# Patient Record
Sex: Male | Born: 1965
Health system: Southern US, Community
[De-identification: ages and names within clinical notes are randomized; demographics above are authoritative.]

## PROBLEM LIST (undated history)

## (undated) DIAGNOSIS — I10 Essential (primary) hypertension: Secondary | ICD-10-CM

## (undated) DIAGNOSIS — E0590 Thyrotoxicosis, unspecified without thyrotoxic crisis or storm: Secondary | ICD-10-CM

## (undated) DIAGNOSIS — E291 Testicular hypofunction: Secondary | ICD-10-CM

## (undated) HISTORY — DX: Testicular hypofunction: E29.1

## (undated) HISTORY — DX: Thyrotoxicosis, unspecified without thyrotoxic crisis or storm: E05.90

## (undated) HISTORY — PX: HEEL SPUR EXCISION: SHX1733

## (undated) HISTORY — DX: Essential (primary) hypertension: I10

---
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
## 1997-11-22 HISTORY — PX: CHOLECYSTECTOMY: SHX55

## 2005-06-22 ENCOUNTER — Ambulatory Visit (HOSPITAL_BASED_OUTPATIENT_CLINIC_OR_DEPARTMENT_OTHER): Admission: RE | Admit: 2005-06-22 | Discharge: 2005-06-22 | Payer: Self-pay | Admitting: Radiation Oncology

## 2005-06-22 ENCOUNTER — Ambulatory Visit (HOSPITAL_COMMUNITY): Admission: RE | Admit: 2005-06-22 | Discharge: 2005-06-22 | Payer: Self-pay | Admitting: Radiation Oncology

## 2016-08-13 ENCOUNTER — Encounter: Payer: Self-pay | Admitting: Endocrinology

## 2016-08-13 ENCOUNTER — Ambulatory Visit (INDEPENDENT_AMBULATORY_CARE_PROVIDER_SITE_OTHER): Payer: 59 | Admitting: Endocrinology

## 2016-08-13 DIAGNOSIS — E291 Testicular hypofunction: Secondary | ICD-10-CM

## 2016-08-13 DIAGNOSIS — E0590 Thyrotoxicosis, unspecified without thyrotoxic crisis or storm: Secondary | ICD-10-CM | POA: Insufficient documentation

## 2016-08-13 LAB — IBC PANEL
IRON: 66 ug/dL (ref 42–165)
SATURATION RATIOS: 17.3 % — AB (ref 20.0–50.0)
TRANSFERRIN: 272 mg/dL (ref 212.0–360.0)

## 2016-08-13 LAB — LUTEINIZING HORMONE: LH: 1.1 m[IU]/mL — AB (ref 1.50–9.30)

## 2016-08-13 LAB — T4, FREE: Free T4: 0.9 ng/dL (ref 0.60–1.60)

## 2016-08-13 LAB — TSH: TSH: 0.3 u[IU]/mL — ABNORMAL LOW (ref 0.35–4.50)

## 2016-08-13 NOTE — Patient Instructions (Addendum)
Testosterone treatment has risks, including increased or decreased fertility (depending on the type of treatment), hair loss, prostate cancer, benign prostate enlargement, blood clots, liver problems, lower hdl ("good cholesterol"), polycythemia (opposite of anemia), sleep apnea, and behavior changes. blood tests are requested for you today.  We'll let you know about the results.  Weight loss helps the testosterone, also.

## 2016-08-13 NOTE — Progress Notes (Signed)
   Subjective:    Patient ID: Xavier Blevins, male    DOB: Aug 13, 1966, 50 y.o.   MRN: [id number]  HPI Pt reports he had puberty at the normal age.  He has 2 biological children.  He says he has never taken illicit androgens.  He has never been on any prescribed medication for hypogonadism.  He does not take antiandrogens or opioids.  He denies any h/o infertility, XRT, or genital infection.  He has never had surgery, or a serious injury to the head.  He does not consume alcohol excessively.  Pt states 1 year of slightly decreased hair throughout the body, and assoc ED.   Past Medical History:  Diagnosis Date  . Hyperthyroidism   . Hypogonadism male     No past surgical history on file.  Social History   Social History  . Marital status: Married    Spouse name: N/A  . Number of children: N/A  . Years of education: N/A   Occupational History  . Not on file.   Social History Main Topics  . Smoking status: Never Smoker  . Smokeless tobacco: Never Used  . Alcohol use Yes  . Drug use: Unknown  . Sexual activity: Not on file   Other Topics Concern  . Not on file   Social History Narrative  . No narrative on file    No current outpatient prescriptions on file prior to visit.   No current facility-administered medications on file prior to visit.     No Known Allergies  Family History  Problem Relation Age of Onset  . Other Father     hypogonadism    BP (!) 156/94   Pulse 87   Ht 5\' 7"  (1.702 m)   Wt 241 lb (109.3 kg)   SpO2 98%   BMI 37.75 kg/m     Review of Systems denies depression, numbness, weight change, decreased urinary stream, gynecomastia, muscle weakness, fever, headache, easy bruising, sob, rash, blurry vision, rhinorrhea, chest pain.        Objective:   Physical Exam VS: see vs page GEN: no distress HEAD: head: no deformity eyes: no periorbital swelling, no proptosis external nose and ears are normal mouth: no lesion seen NECK: supple,  thyroid is not enlarged CHEST WALL: no deformity LUNGS: clear to auscultation BREASTS:  Mild bilat pseudogynecomastia CV: reg rate and rhythm, no murmur ABD: abdomen is soft, nontender.  no hepatosplenomegaly.  not distended.  no hernia GENITALIA:  Normal male.   MUSCULOSKELETAL: muscle bulk and strength are grossly normal.  no obvious joint swelling.  gait is normal and steady EXTEMITIES: no deformity.  no edema PULSES: no carotid bruit NEURO:  cn 2-12 grossly intact.   readily moves all 4's.  sensation is intact to touch on all 4's SKIN:  Normal texture and temperature.  No rash or suspicious lesion is visible.  Normal adult male hair distribution NODES:  None palpable at the neck PSYCH: alert, well-oriented.  Does not appear anxious nor depressed.   outside test results are reviewed: Testosterone=239 TSH=0.30 and 0.34 LH=4  I have reviewed outside records, and summarized: Pt was noted to have hypogonadism.  He had ED sxs, but was noted to be in good general health    Assessment & Plan:  Hypogonadism, central, new to me.  he declines clomid and other rx.  He just wants to check to make sure nothing serious. ED: he declines rx, as he says it is mild.

## 2016-08-14 LAB — PROLACTIN: PROLACTIN: 5.6 ng/mL (ref 2.0–18.0)

## 2016-08-15 LAB — TESTOSTERONE,FREE AND TOTAL
TESTOSTERONE FREE: 5.6 pg/mL — AB (ref 6.8–21.5)
TESTOSTERONE: 206 ng/dL — AB (ref 264–916)

## 2016-08-17 ENCOUNTER — Telehealth: Payer: Self-pay

## 2016-08-17 ENCOUNTER — Telehealth: Payer: Self-pay | Admitting: Endocrinology

## 2016-08-17 DIAGNOSIS — E291 Testicular hypofunction: Secondary | ICD-10-CM

## 2016-08-17 NOTE — Telephone Encounter (Signed)
I contacted the patient and left a voicemail advising of message via voicemail. Requested a call back from the patient if he would like to start the oral medication for his testosterone.

## 2016-08-17 NOTE — Telephone Encounter (Signed)
Patient has question about his testosterone medication he do not know what the name of it is. Please advise

## 2016-08-17 NOTE — Telephone Encounter (Signed)
I contacted the patient and he stated he is ok with the oral testosterone

## 2016-08-17 NOTE — Telephone Encounter (Signed)
-----   Message from Romero BellingSean Ellison, MD sent at 08/15/2016  7:43 PM EDT ----- please call patient: Testosterone is slightly low again. Do you want me to prescribe a pill to increase it? Also, your thyroid is slightly off again.  All we need to do for this is to recheck in the future.

## 2016-08-19 MED ORDER — CLOMIPHENE CITRATE 50 MG PO TABS: ORAL_TABLET | ORAL | 5 refills | Status: DC

## 2016-08-19 NOTE — Telephone Encounter (Signed)
See message and please advise which medication needs to be called in.

## 2016-08-19 NOTE — Telephone Encounter (Signed)
please call patient: I have sent a prescription to your pharmacy, for the pill for the testosterone.  Please redo the blood test in 1 month.  Please come back for a follow-up appointment in 6 months.

## 2016-08-19 NOTE — Telephone Encounter (Signed)
Xavier MilletMegan this is your patient

## 2016-08-19 NOTE — Telephone Encounter (Signed)
Patient stated the prescription for his testosterone was not sent to the pharmacy. Please advise

## 2016-08-20 NOTE — Telephone Encounter (Signed)
I contacted the patient and advised of message. Patient voiced understanding and had no further questions.  

## 2016-09-20 ENCOUNTER — Ambulatory Visit (INDEPENDENT_AMBULATORY_CARE_PROVIDER_SITE_OTHER): Payer: 59 | Admitting: Endocrinology

## 2016-09-20 ENCOUNTER — Encounter: Payer: Self-pay | Admitting: Endocrinology

## 2016-09-20 VITALS — BP 138/86 | HR 91 | Ht 67.0 in | Wt 241.0 lb

## 2016-09-20 DIAGNOSIS — E0590 Thyrotoxicosis, unspecified without thyrotoxic crisis or storm: Secondary | ICD-10-CM | POA: Diagnosis not present

## 2016-09-20 DIAGNOSIS — E291 Testicular hypofunction: Secondary | ICD-10-CM | POA: Diagnosis not present

## 2016-09-20 LAB — TSH: TSH: 0.26 u[IU]/mL — AB (ref 0.35–4.50)

## 2016-09-20 NOTE — Progress Notes (Signed)
   Subjective:    Patient ID: Xavier Blevins, male    DOB: 21-Aug-1966, 50 y.o.   MRN: [id number]  HPI Pt returns for f/u of idiopathic central hypogonadism (dx'ed 2017; he has 2 biological children; he was rx'ed clomid).  He takes clomid as rx'ed. He says fatigue is improved Past Medical History:  Diagnosis Date  . Hyperthyroidism   . Hypogonadism male     No past surgical history on file.  Social History   Social History  . Marital status: Married    Spouse name: N/A  . Number of children: N/A  . Years of education: N/A   Occupational History  . Not on file.   Social History Main Topics  . Smoking status: Never Smoker  . Smokeless tobacco: Never Used  . Alcohol use Yes  . Drug use: Unknown  . Sexual activity: Not on file   Other Topics Concern  . Not on file   Social History Narrative  . No narrative on file    Current Outpatient Prescriptions on File Prior to Visit  Medication Sig Dispense Refill  . clomiPHENE (CLOMID) 50 MG tablet 1/4 tab daily 10 tablet 5  . Doxylamine Succinate, Sleep, (NITETIME SLEEP-AID PO) Take by mouth.    . Multiple Vitamin (MULTIVITAMIN) capsule Take 1 capsule by mouth daily.     No current facility-administered medications on file prior to visit.     No Known Allergies  Family History  Problem Relation Age of Onset  . Other Father     hypogonadism    BP 138/86   Pulse 91   Ht 5\' 7"  (1.702 m)   Wt 241 lb (109.3 kg)   SpO2 97%   BMI 37.75 kg/m    Review of Systems Denies decreased urination and breast pain.     Objective:   Physical Exam VITAL SIGNS:  See vs page GENERAL: no distress NECK: There is no palpable thyroid enlargement.  No thyroid nodule is palpable.  No palpable lymphadenopathy at the anterior neck. Ext: no edema.      Assessment & Plan:  Hypogonadism: due for recheck. Please continue the same medication. Check labs today

## 2016-09-20 NOTE — Patient Instructions (Signed)
blood tests are requested for you today.  We'll let you know about the results. Testosterone treatment has risks, including increased or decreased fertility (depending on the type of treatment), hair loss, prostate cancer, benign prostate enlargement, blood clots, liver problems, lower hdl ("good cholesterol"), polycythemia (opposite of anemia), sleep apnea, and behavior changes.   Please return in 1 year.   

## 2016-09-22 LAB — TESTOSTERONE,FREE AND TOTAL
TESTOSTERONE: 690 ng/dL (ref 264–916)
Testosterone, Free: 17.3 pg/mL (ref 6.8–21.5)

## 2016-12-28 DIAGNOSIS — H00014 Hordeolum externum left upper eyelid: Secondary | ICD-10-CM | POA: Diagnosis not present

## 2017-04-01 DIAGNOSIS — M1732 Unilateral post-traumatic osteoarthritis, left knee: Secondary | ICD-10-CM | POA: Diagnosis not present

## 2017-04-04 DIAGNOSIS — K645 Perianal venous thrombosis: Secondary | ICD-10-CM | POA: Diagnosis not present

## 2017-05-01 ENCOUNTER — Other Ambulatory Visit: Payer: Self-pay | Admitting: Endocrinology

## 2017-05-30 DIAGNOSIS — M1712 Unilateral primary osteoarthritis, left knee: Secondary | ICD-10-CM | POA: Diagnosis not present

## 2017-06-06 DIAGNOSIS — M1712 Unilateral primary osteoarthritis, left knee: Secondary | ICD-10-CM | POA: Diagnosis not present

## 2017-06-13 DIAGNOSIS — M1712 Unilateral primary osteoarthritis, left knee: Secondary | ICD-10-CM | POA: Diagnosis not present

## 2017-09-13 DIAGNOSIS — Z23 Encounter for immunization: Secondary | ICD-10-CM | POA: Diagnosis not present

## 2017-09-19 ENCOUNTER — Ambulatory Visit (INDEPENDENT_AMBULATORY_CARE_PROVIDER_SITE_OTHER): Payer: 59 | Admitting: Endocrinology

## 2017-09-19 ENCOUNTER — Encounter: Payer: Self-pay | Admitting: Endocrinology

## 2017-09-19 VITALS — BP 140/82 | HR 85 | Wt 232.2 lb

## 2017-09-19 DIAGNOSIS — Z125 Encounter for screening for malignant neoplasm of prostate: Secondary | ICD-10-CM

## 2017-09-19 DIAGNOSIS — E291 Testicular hypofunction: Secondary | ICD-10-CM | POA: Diagnosis not present

## 2017-09-19 DIAGNOSIS — E0590 Thyrotoxicosis, unspecified without thyrotoxic crisis or storm: Secondary | ICD-10-CM

## 2017-09-19 LAB — TSH: TSH: 0.2 u[IU]/mL — ABNORMAL LOW (ref 0.35–4.50)

## 2017-09-19 LAB — PSA: PSA: 0.44 ng/mL (ref 0.10–4.00)

## 2017-09-19 NOTE — Patient Instructions (Addendum)
blood tests are requested for you today.  We'll let you know about the results. Testosterone treatment has risks, including increased or decreased fertility (depending on the type of treatment), hair loss, prostate cancer, benign prostate enlargement, blood clots, liver problems, lower hdl ("good cholesterol"), polycythemia (opposite of anemia), sleep apnea, and behavior changes.   Please return in 1 year.   

## 2017-09-19 NOTE — Progress Notes (Signed)
   Subjective:    Patient ID: Xavier Blevins, male    DOB: Feb 26, 1966, 51 y.o.   MRN: [id number]  HPI Pt returns for f/u of idiopathic central hypogonadism (dx'ed 2017; he has 2 biological children; prolactin was normal; he was rx'ed clomid).  He takes clomid as rx'ed.  pt states he feels well in general.  He denies palpitations and ED sxs.  Past Medical History:  Diagnosis Date  . Hyperthyroidism   . Hypogonadism male     No past surgical history on file.  Social History   Social History  . Marital status: Married    Spouse name: N/A  . Number of children: N/A  . Years of education: N/A   Occupational History  . Not on file.   Social History Main Topics  . Smoking status: Never Smoker  . Smokeless tobacco: Never Used  . Alcohol use Yes  . Drug use: Unknown  . Sexual activity: Not on file   Other Topics Concern  . Not on file   Social History Narrative  . No narrative on file    Current Outpatient Prescriptions on File Prior to Visit  Medication Sig Dispense Refill  . clomiPHENE (CLOMID) 50 MG tablet TAKE 1/4 TABLET BY MOUTH DAILY 10 tablet 5  . Multiple Vitamin (MULTIVITAMIN) capsule Take 1 capsule by mouth daily.    . Doxylamine Succinate, Sleep, (NITETIME SLEEP-AID PO) Take by mouth.     No current facility-administered medications on file prior to visit.     No Known Allergies  Family History  Problem Relation Age of Onset  . Other Father        hypogonadism    BP 140/82   Pulse 85   Wt 232 lb 3.2 oz (105.3 kg)   SpO2 98%   BMI 36.37 kg/m    Review of Systems Denies tremor and sob.     Objective:   Physical Exam VITAL SIGNS:  See vs page.  GENERAL: no distress. Neck: thyroid is slightly and diffusely enlarged.  Ext: no leg edema.       Assessment & Plan:  Mild hyperthyroidism, due for recheck Hypogonadism: clinically well-controlled.   Patient Instructions  blood tests are requested for you today.  We'll let you know about the  results.  Testosterone treatment has risks, including increased or decreased fertility (depending on the type of treatment), hair loss, prostate cancer, benign prostate enlargement, blood clots, liver problems, lower hdl ("good cholesterol"), polycythemia (opposite of anemia), sleep apnea, and behavior changes.  Please return in 1 year.

## 2017-09-20 LAB — TESTOSTERONE,FREE AND TOTAL
TESTOSTERONE FREE: 12.4 pg/mL (ref 6.8–21.5)
Testosterone: 486 ng/dL (ref 264–916)

## 2017-09-21 ENCOUNTER — Other Ambulatory Visit: Payer: Self-pay

## 2017-09-21 ENCOUNTER — Telehealth: Payer: Self-pay | Admitting: Endocrinology

## 2017-09-21 MED ORDER — CLOMIPHENE CITRATE 50 MG PO TABS: 12.5000 mg | ORAL_TABLET | Freq: Every day | ORAL | 5 refills | Status: DC

## 2017-09-21 NOTE — Telephone Encounter (Signed)
Caller Name: Sherlie BanDaniel Blevins  Best Number: [phone]  Pharmacy: PLEASE Change to [facility]  [address] [place]  Reason for call: pt currently uses [facility], however he would like to change clomiPHENE (CLOMID) 50 MG tablet to [facility] on [address] [facility] [place].  Please call in a refill

## 2017-09-21 NOTE — Telephone Encounter (Signed)
done

## 2017-09-26 ENCOUNTER — Telehealth: Payer: Self-pay | Admitting: Endocrinology

## 2017-09-26 NOTE — Telephone Encounter (Signed)
Please call patient at ph# [phone] re: a prescription.He needs clarification. Old prescription said 1/4 pill a day. New script says 1/2 pill a day. Patient wants to know why the increase.

## 2017-09-26 NOTE — Telephone Encounter (Signed)
Ok, as of now, are you taking 1/4/or 1/2 qd?

## 2017-09-26 NOTE — Telephone Encounter (Signed)
Patient need clarification, clomiPHENE (CLOMID) 50 MG tablet [1753] Please advise

## 2017-09-26 NOTE — Telephone Encounter (Signed)
I called patient & left VM for him to return call so I could clarify prescription.

## 2017-09-27 MED ORDER — CLOMIPHENE CITRATE 50 MG PO TABS: ORAL_TABLET | ORAL | 5 refills | Status: DC

## 2017-09-27 NOTE — Telephone Encounter (Signed)
please call patient: Here is what happened.  It was ordered as 1/4 per day, but the computer assumed it was a mistake, and changed to 1/2.  We have overridden this.  Thank you for the call

## 2017-09-27 NOTE — Telephone Encounter (Signed)
Called and clarified with patient.

## 2017-09-27 NOTE — Telephone Encounter (Signed)
He has been taking 1/4.

## 2018-02-01 DIAGNOSIS — M1712 Unilateral primary osteoarthritis, left knee: Secondary | ICD-10-CM | POA: Diagnosis not present

## 2018-02-01 DIAGNOSIS — M25562 Pain in left knee: Secondary | ICD-10-CM | POA: Diagnosis not present

## 2018-02-09 ENCOUNTER — Ambulatory Visit: Payer: Self-pay | Admitting: Orthopedic Surgery

## 2018-02-27 ENCOUNTER — Ambulatory Visit: Payer: Self-pay | Admitting: Orthopedic Surgery

## 2018-02-27 NOTE — H&P (View-Only) (Signed)
Xavier Blevins is an 52 y.o. male.   Chief Complaint: L knee pain HPI: The patient is scheduled for a left total knee replacement and removal of retained hardware by Dr. Beane. The surgery is scheduled on 03/15/18 at [facility].  Xavier Blevins reports progressively worsening left knee pain for many years, has a history of ACL reconstruction and knee arthroscopy. Reports symptoms of pain, stiffness and instability. At this point the pain is limiting his activities of daily living and quality-of-life and he would like to proceed with further intervention. He is alert he failed injection therapy, quad strengthening, physical therapy, activity modifications, bracing, medications, relative rest.  Dr. Beane and the patient mutually agreed to proceed with a left total knee replacement. Risks and benefits of the procedure were discussed including stiffness, suboptimal range of motion, persistent pain, infection requiring removal of prosthesis and reinsertion, need for prophylactic antibiotics in the future, for example, dental procedures, possible need for manipulation, revision in the future and also anesthetic complications including DVT, PE, etc. We discussed the perioperative course, time in the hospital, postoperative recovery and the need for elevation to control swelling. We also discussed the predicted range of motion and the probability that squatting and kneeling would be unobtainable in the future. In addition, postoperative anticoagulation was discussed. We have obtained preoperative medical clearance as necessary. Provided illustrated handout and discussed it in detail. They will enroll in the total joint replacement educational forum at the hospital.  His preop appointment is 1 week from today at [facility].  Past Medical History:  Diagnosis Date  . Hyperthyroidism   . Hypogonadism male    Surgical History Foot/Ankle Surgery - spur removed right great toe Cholecystectomy Knee Arthroscopy  - left x 3 L knee ACL reconstruction  Medications clomiPHENE citrate 50 mg tablet TAKE 1/4 TABLET BY MOUTH DAILY Ibuprofen prn  Family History  Problem Relation Age of Onset  . Other Father        hypogonadism  Father: Hypertensive disorder, Leukemia, Diabetes mellitus  Social History:  reports that he has never smoked. He has never used smokeless tobacco. He reports that he drinks alcohol. His drug history is not on file.  Allergies: No Known Allergies   Review of Systems  Constitutional: Negative.   HENT: Negative.   Eyes: Negative.   Respiratory: Positive for cough.        Believes allergy related. Improved with mucinex  Cardiovascular: Negative.   Gastrointestinal: Negative.   Genitourinary: Negative.   Musculoskeletal: Positive for joint pain.  Skin: Negative.     There were no vitals taken for this visit. Physical Exam  Constitutional: He is oriented to person, place, and time. He appears well-developed and well-nourished.  HENT:  Head: Normocephalic.  Eyes: Pupils are equal, round, and reactive to light.  Neck: Normal range of motion.  Cardiovascular: Normal rate.  Respiratory: Effort normal.  GI: Soft.  Musculoskeletal:  On examination of the left knee, well healed scars medial and lateral aspect of the knee from ACL reconstruction. He is tender medial joint line. Nontender lateral joint line, patellar tendon, quadriceps tendon, patella, peroneal nerve and popliteal space. No calf pain or sign of DVT. No pain or laxity with varus or valgus stress. No instability noted. Negative McMurray's. Mild effusion noted. Range of motion is approximately -5-90. Positive patellofemoral crepitus. No patellofemoral pain on compression. Sensation intact distally.  Neurological: He is alert and oriented to person, place, and time.  Skin: Skin is warm and dry.      Prior x-rays from May 2018 reviewed, retained hardware following ACL reconstruction. He is bone-on-bone in the medial  compartment, with a varus deformity. He also demonstrates end-stage degenerative changes in the patellofemoral compartment.  Assessment/Plan Left knee end-stage posttraumatic osteoarthritis  Plan: Pt with end-stage Left knee DJD, bone-on-bone, refractory to conservative tx, scheduled for Left total knee replacement and removal of retained hardware by Dr. Beane on 03/15/18. We again discussed the procedure itself as well as risks, complications and alternatives, including but not limited to DVT, PE, infx, bleeding, failure of procedure, need for secondary procedure including manipulation, nerve injury, ongoing pain/symptoms, anesthesia risk, even stroke or death. Also discussed typical post-op protocols, activity restrictions, need for PT, flexion/extension exercises, time out of work. Discussed need for DVT ppx post-op per protocol. Discussed dental ppx and infx prevention. Also discussed limitations post-operatively such as kneeling and squatting. All questions were answered. Patient desires to proceed with surgery as scheduled.  Will hold supplements, ASA and NSAIDs accordingly. Will remain NPO after midnight the night before surgery. Will present to [facility] for pre-op testing as scheduled. Anticipate hospital stay to include at least 2 midnights to ensure proper pain control. Plan ASA 325mg BID for DVT ppx post-op. Plan Percocet, Robaxin, Colace, Miralax. Plan home with HHPT post-op with family members at home for assistance then transition to outpt at his post op visit. Will follow up 10-14 days post-op for staple removal and xrays. He is hoping to return to work after his 2 week post-op visit.  Plan Left total knee replacement and removal of retained hardware   BISSELL, JACLYN M., PA-C for Dr. Beane 02/27/2018, 9:54 AM   

## 2018-02-27 NOTE — H&P (Signed)
Xavier BanDaniel Blevins is an 52 y.o. male.   Chief Complaint: L knee pain HPI: The patient is scheduled for a left total knee replacement and removal of retained hardware by Dr. Shelle IronBeane. The [facility].  Xavier Blevins reports progressively worsening left knee pain for many years, has a history of ACL reconstruction and knee arthroscopy. Reports symptoms of pain, stiffness and instability. At this point the pain is limiting his activities of daily living and quality-of-life and he would like to proceed with further intervention. He is alert he failed injection therapy, quad strengthening, physical therapy, activity modifications, bracing, medications, relative rest.  Dr. Shelle IronBeane and the patient mutually agreed to proceed with a left total knee replacement. Risks and benefits of the procedure were discussed including stiffness, suboptimal range of motion, persistent pain, infection requiring removal of prosthesis and reinsertion, need for prophylactic antibiotics in the future, for example, dental procedures, possible need for manipulation, revision in the future and also anesthetic complications including DVT, PE, etc. We discussed the perioperative course, time in the hospital, postoperative recovery and the need for elevation to control swelling. We also discussed the predicted range of motion and the probability that squatting and kneeling would be unobtainable in the future. In addition, postoperative anticoagulation was discussed. We have obtained preoperative medical clearance as necessary. Provided illustrated handout and discussed it in detail. They will enroll in the total joint replacement educational forum at the hospital.  [facility]  Past Medical History:  Diagnosis Date  . Hyperthyroidism   . Hypogonadism male    Surgical History Foot/Ankle Surgery - spur removed right great toe Cholecystectomy Knee Arthroscopy  - left x 3 L knee ACL reconstruction  Medications clomiPHENE citrate 50 mg tablet TAKE 1/4 TABLET BY MOUTH DAILY Ibuprofen prn  Family History  Problem Relation Age of Onset  . Other Father        hypogonadism  Father: Hypertensive disorder, Leukemia, Diabetes mellitus  Social History:  reports that he has never smoked. He has never used smokeless tobacco. He reports that he drinks alcohol. His drug history is not on file.  Allergies: No Known Allergies   Review of Systems  Constitutional: Negative.   HENT: Negative.   Eyes: Negative.   Respiratory: Positive for cough.        Believes allergy related. Improved with mucinex  Cardiovascular: Negative.   Gastrointestinal: Negative.   Genitourinary: Negative.   Musculoskeletal: Positive for joint pain.  Skin: Negative.     There were no vitals taken for this visit. Physical Exam  Constitutional: He is oriented to person, place, and time. He appears well-developed and well-nourished.  HENT:  Head: Normocephalic.  Eyes: Pupils are equal, round, and reactive to light.  Neck: Normal range of motion.  Cardiovascular: Normal rate.  Respiratory: Effort normal.  GI: Soft.  Musculoskeletal:  On examination of the left knee, well healed scars medial and lateral aspect of the knee from ACL reconstruction. He is tender medial joint line. Nontender lateral joint line, patellar tendon, quadriceps tendon, patella, peroneal nerve and popliteal space. No calf pain or sign of DVT. No pain or laxity with varus or valgus stress. No instability noted. Negative McMurray's. Mild effusion noted. Range of motion is approximately -5-90. Positive patellofemoral crepitus. No patellofemoral pain on compression. Sensation intact distally.  Neurological: He is alert and oriented to person, place, and time.  Skin: Skin is warm and dry.  Prior x-rays from May 2018 reviewed, retained hardware following ACL reconstruction. He is bone-on-bone in the medial  compartment, with a varus deformity. He also demonstrates end-stage degenerative changes in the patellofemoral compartment.  Assessment/Plan Left knee end-stage posttraumatic osteoarthritis  Plan: Pt with end-stage Left knee DJD, bone-on-bone, refractory to conservative tx, scheduled for Left total knee replacement and removal of retained hardware by Dr. Shelle Iron on 03/15/18. We again discussed the procedure itself as well as risks, complications and alternatives, including but not limited to DVT, PE, infx, bleeding, failure of procedure, need for secondary procedure including manipulation, nerve injury, ongoing pain/symptoms, anesthesia risk, even stroke or death. Also discussed typical post-op protocols, activity restrictions, need for PT, flexion/extension exercises, time out of work. Discussed need for DVT ppx post-op per protocol. Discussed dental ppx and infx prevention. Also discussed limitations post-operatively such as kneeling and squatting. All questions were answered. Patient desires to proceed with surgery as scheduled.  Will hold supplements, ASA and NSAIDs accordingly. Will remain NPO after midnight the night before surgery. Will present to [facility] for pre-op testing as scheduled. Anticipate hospital stay to include at least 2 midnights to ensure proper pain control. Plan ASA 325mg  BID for DVT ppx post-op. Plan Percocet, Robaxin, Colace, Miralax. Plan home with HHPT post-op with family members at home for assistance then transition to outpt at his post op visit. Will follow up 10-14 days post-op for staple removal and xrays. He is hoping to return to work after his 2 week post-op visit.  Plan Left total knee replacement and removal of retained hardware   [name]., PA-C for Dr. Shelle Iron 02/27/2018, 9:54 AM

## 2018-03-03 NOTE — Patient Instructions (Addendum)
Sherlie BanDaniel Minchew  03/03/2018   Your procedure is scheduled on: 03-15-18   [facility]  Entrance Report to Admitting at 5:30 AM    Call this number if you have problems the morning of surgery [phone]   Remember: Do not eat food or drink liquids :After Midnight.     Take these medicines the morning of surgery with A SIP OF WATER: None                                You may not have any metal on your body including hair pins and              piercings  Do not wear jewelry, lotions, powders or deodorant             Men may shave face and neck.   Do not bring valuables to the hospital. [facility] IS NOT             RESPONSIBLE   FOR VALUABLES.  Contacts, dentures or bridgework may not be worn into surgery.  Leave suitcase in the car. After surgery it may be brought to your room.                  Please read over the following fact sheets you were given: _____________________________________________________________________          [facility] - Preparing for Surgery Before surgery, you can play an important role.  Because skin is not sterile, your skin needs to be as free of germs as possible.  You can reduce the number of germs on your skin by washing with CHG (chlorahexidine gluconate) soap before surgery.  CHG is an antiseptic cleaner which kills germs and bonds with the skin to continue killing germs even after washing. Please DO NOT use if you have an allergy to CHG or antibacterial soaps.  If your skin becomes reddened/irritated stop using the CHG and inform your nurse when you arrive at Short Stay. Do not shave (including legs and underarms) for at least 48 hours prior to the first CHG shower.  You may shave your face/neck. Please follow these instructions carefully:  1.  Shower with CHG Soap the night before surgery and the  morning of Surgery.  2.  If you choose to wash your hair, wash your hair first as usual with your  normal   shampoo.  3.  After you shampoo, rinse your hair and body thoroughly to remove the  shampoo.                           4.  Use CHG as you would any other liquid soap.  You can apply chg directly  to the skin and wash                       Gently with a scrungie or clean washcloth.  5.  Apply the CHG Soap to your body ONLY FROM THE NECK DOWN.   Do not use on face/ open                           Wound or open sores. Avoid contact with eyes, ears mouth and genitals (private parts).  Wash face,  Genitals (private parts) with your normal soap.             6.  Wash thoroughly, paying special attention to the area where your surgery  will be performed.  7.  Thoroughly rinse your body with warm water from the neck down.  8.  DO NOT shower/wash with your normal soap after using and rinsing off  the CHG Soap.                9.  Pat yourself dry with a clean towel.            10.  Wear clean pajamas.            11.  Place clean sheets on your bed the night of your first shower and do not  sleep with pets. Day of Surgery : Do not apply any lotions/deodorants the morning of surgery.  Please wear clean clothes to the hospital/surgery center.  FAILURE TO FOLLOW THESE INSTRUCTIONS MAY RESULT IN THE CANCELLATION OF YOUR SURGERY PATIENT SIGNATURE_________________________________  NURSE SIGNATURE__________________________________  ________________________________________________________________________   Adam Phenix  An incentive spirometer is a tool that can help keep your lungs clear and active. This tool measures how well you are filling your lungs with each breath. Taking long deep breaths may help reverse or decrease the chance of developing breathing (pulmonary) problems (especially infection) following:  A long period of time when you are unable to move or be active. BEFORE THE PROCEDURE   If the spirometer includes an indicator to show your best effort, your nurse or  respiratory therapist will set it to a desired goal.  If possible, sit up straight or lean slightly forward. Try not to slouch.  Hold the incentive spirometer in an upright position. INSTRUCTIONS FOR USE  1. Sit on the edge of your bed if possible, or sit up as far as you can in bed or on a chair. 2. Hold the incentive spirometer in an upright position. 3. Breathe out normally. 4. Place the mouthpiece in your mouth and seal your lips tightly around it. 5. Breathe in slowly and as deeply as possible, raising the piston or the ball toward the top of the column. 6. Hold your breath for 3-5 seconds or for as long as possible. Allow the piston or ball to fall to the bottom of the column. 7. Remove the mouthpiece from your mouth and breathe out normally. 8. Rest for a few seconds and repeat Steps 1 through 7 at least 10 times every 1-2 hours when you are awake. Take your time and take a few normal breaths between deep breaths. 9. The spirometer may include an indicator to show your best effort. Use the indicator as a goal to work toward during each repetition. 10. After each set of 10 deep breaths, practice coughing to be sure your lungs are clear. If you have an incision (the cut made at the time of surgery), support your incision when coughing by placing a pillow or rolled up towels firmly against it. Once you are able to get out of bed, walk around indoors and cough well. You may stop using the incentive spirometer when instructed by your caregiver.  RISKS AND COMPLICATIONS  Take your time so you do not get dizzy or light-headed.  If you are in pain, you may need to take or ask for pain medication before doing incentive spirometry. It is harder to take a deep breath if you are having pain.  AFTER USE  Rest and breathe slowly and easily.  It can be helpful to keep track of a log of your progress. Your caregiver can provide you with a simple table to help with this. If you are using the  spirometer at home, follow these instructions: [place] IF:   You are having difficultly using the spirometer.  You have trouble using the spirometer as often as instructed.  Your pain medication is not giving enough relief while using the spirometer.  You develop fever of 100.5 F (38.1 C) or higher. SEEK IMMEDIATE MEDICAL CARE IF:   You cough up bloody sputum that had not been present before.  You develop fever of 102 F (38.9 C) or greater.  You develop worsening pain at or near the incision site. MAKE SURE YOU:   Understand these instructions.  Will watch your condition.  Will get help right away if you are not doing well or get worse. Document Released: 03/21/2007 Document Revised: 01/31/2012 Document Reviewed: 05/22/2007 [facility] Patient Information 2014 [place], [place].   ________________________________________________________________________

## 2018-03-06 ENCOUNTER — Other Ambulatory Visit: Payer: Self-pay

## 2018-03-06 ENCOUNTER — Encounter (HOSPITAL_COMMUNITY)
Admission: RE | Admit: 2018-03-06 | Discharge: 2018-03-06 | Disposition: A | Discharge status: Home / Self Care | Payer: 59 | Source: Ambulatory Visit | Attending: Specialist | Admitting: Specialist

## 2018-03-06 ENCOUNTER — Encounter (HOSPITAL_COMMUNITY): Payer: Self-pay

## 2018-03-06 DIAGNOSIS — Z01812 Encounter for preprocedural laboratory examination: Secondary | ICD-10-CM | POA: Insufficient documentation

## 2018-03-06 DIAGNOSIS — M1732 Unilateral post-traumatic osteoarthritis, left knee: Secondary | ICD-10-CM | POA: Insufficient documentation

## 2018-03-06 LAB — BASIC METABOLIC PANEL
Anion gap: 9 (ref 5–15)
BUN: 19 mg/dL (ref 6–20)
CALCIUM: 8.7 mg/dL — AB (ref 8.9–10.3)
CHLORIDE: 108 mmol/L (ref 101–111)
CO2: 24 mmol/L (ref 22–32)
CREATININE: 0.88 mg/dL (ref 0.61–1.24)
GFR calc Af Amer: >60 mL/min (ref >60)
GFR calc non Af Amer: >60 mL/min (ref >60)
GLUCOSE: 86 mg/dL (ref 65–99)
Potassium: 3.9 mmol/L (ref 3.5–5.1)
Sodium: 141 mmol/L (ref 135–145)

## 2018-03-06 LAB — URINALYSIS, ROUTINE W REFLEX MICROSCOPIC
BILIRUBIN URINE: NEGATIVE
GLUCOSE, UA: NEGATIVE mg/dL
HGB URINE DIPSTICK: NEGATIVE
Ketones, ur: NEGATIVE mg/dL
Leukocytes, UA: NEGATIVE
NITRITE: NEGATIVE
PH: 7 (ref 5.0–8.0)
Protein, ur: NEGATIVE mg/dL
SPECIFIC GRAVITY, URINE: 1.019 (ref 1.005–1.030)

## 2018-03-06 LAB — CBC
HCT: 41.6 % (ref 39.0–52.0)
Hemoglobin: 13 g/dL (ref 13.0–17.0)
MCH: 25.4 pg — AB (ref 26.0–34.0)
MCHC: 31.3 g/dL (ref 30.0–36.0)
MCV: 81.4 fL (ref 78.0–100.0)
PLATELETS: 203 10*3/uL (ref 150–400)
RBC: 5.11 MIL/uL (ref 4.22–5.81)
RDW: 16.7 % — AB (ref 11.5–15.5)
WBC: 5 10*3/uL (ref 4.0–10.5)

## 2018-03-06 LAB — PROTIME-INR
INR: 1.07
PROTHROMBIN TIME: 13.8 s (ref 11.4–15.2)

## 2018-03-06 LAB — SURGICAL PCR SCREEN
MRSA, PCR: NEGATIVE
STAPHYLOCOCCUS AUREUS: NEGATIVE

## 2018-03-06 LAB — APTT: aPTT: 31 seconds (ref 24–36)

## 2018-03-14 MED ORDER — TRANEXAMIC ACID 1000 MG/10ML IV SOLN
1000.0000 mg | INTRAVENOUS | Status: AC
  Administered 2018-03-15: 1000 mg via INTRAVENOUS
  Filled 2018-03-14: qty 1100

## 2018-03-15 ENCOUNTER — Inpatient Hospital Stay (HOSPITAL_COMMUNITY): Payer: 59 | Admitting: Certified Registered Nurse Anesthetist

## 2018-03-15 ENCOUNTER — Inpatient Hospital Stay (HOSPITAL_COMMUNITY): Payer: 59

## 2018-03-15 ENCOUNTER — Encounter (HOSPITAL_COMMUNITY): Payer: Self-pay

## 2018-03-15 ENCOUNTER — Inpatient Hospital Stay (HOSPITAL_COMMUNITY)
Admission: RE | Admit: 2018-03-15 | Discharge: 2018-03-17 | DRG: 470 | Disposition: A | Discharge status: Home Health | Payer: 59 | Source: Ambulatory Visit | Attending: Specialist | Admitting: Specialist

## 2018-03-15 ENCOUNTER — Other Ambulatory Visit: Payer: Self-pay

## 2018-03-15 ENCOUNTER — Encounter (HOSPITAL_COMMUNITY)
Admission: RE | Disposition: A | Discharge status: Home Health | Payer: Self-pay | Source: Ambulatory Visit | Attending: Specialist

## 2018-03-15 DIAGNOSIS — M1712 Unilateral primary osteoarthritis, left knee: Secondary | ICD-10-CM | POA: Diagnosis present

## 2018-03-15 DIAGNOSIS — R269 Unspecified abnormalities of gait and mobility: Secondary | ICD-10-CM | POA: Diagnosis not present

## 2018-03-15 DIAGNOSIS — Z79899 Other long term (current) drug therapy: Secondary | ICD-10-CM

## 2018-03-15 DIAGNOSIS — G8918 Other acute postprocedural pain: Secondary | ICD-10-CM | POA: Diagnosis not present

## 2018-03-15 DIAGNOSIS — Z8639 Personal history of other endocrine, nutritional and metabolic disease: Secondary | ICD-10-CM | POA: Diagnosis not present

## 2018-03-15 DIAGNOSIS — M1732 Unilateral post-traumatic osteoarthritis, left knee: Principal | ICD-10-CM

## 2018-03-15 DIAGNOSIS — E785 Hyperlipidemia, unspecified: Secondary | ICD-10-CM | POA: Diagnosis not present

## 2018-03-15 DIAGNOSIS — Z96659 Presence of unspecified artificial knee joint: Secondary | ICD-10-CM

## 2018-03-15 DIAGNOSIS — Z471 Aftercare following joint replacement surgery: Secondary | ICD-10-CM | POA: Diagnosis not present

## 2018-03-15 DIAGNOSIS — T84498A Other mechanical complication of other internal orthopedic devices, implants and grafts, initial encounter: Secondary | ICD-10-CM | POA: Diagnosis not present

## 2018-03-15 DIAGNOSIS — Z96652 Presence of left artificial knee joint: Secondary | ICD-10-CM | POA: Diagnosis not present

## 2018-03-15 HISTORY — PX: HARDWARE REMOVAL: SHX979

## 2018-03-15 HISTORY — PX: TOTAL KNEE ARTHROPLASTY: SHX125

## 2018-03-15 SURGERY — ARTHROPLASTY, KNEE, TOTAL
Anesthesia: General | Site: Knee | Laterality: Left

## 2018-03-15 MED ORDER — RISAQUAD PO CAPS
1.0000 | ORAL_CAPSULE | Freq: Every day | ORAL | Status: DC
  Administered 2018-03-15 – 2018-03-17 (×3): 1 via ORAL
  Filled 2018-03-15 (×3): qty 1

## 2018-03-15 MED ORDER — DEXAMETHASONE SODIUM PHOSPHATE 10 MG/ML IJ SOLN
INTRAMUSCULAR | Status: DC | PRN
  Administered 2018-03-15: 10 mg via INTRAVENOUS

## 2018-03-15 MED ORDER — METHOCARBAMOL 500 MG PO TABS
500.0000 mg | ORAL_TABLET | Freq: Four times a day (QID) | ORAL | Status: DC | PRN
  Administered 2018-03-15 – 2018-03-17 (×6): 500 mg via ORAL
  Filled 2018-03-15 (×6): qty 1

## 2018-03-15 MED ORDER — HYDROMORPHONE HCL 1 MG/ML IJ SOLN: 0.2500 mg | INTRAMUSCULAR | Status: DC | PRN

## 2018-03-15 MED ORDER — ONDANSETRON HCL 4 MG PO TABS: 4.0000 mg | ORAL_TABLET | Freq: Four times a day (QID) | ORAL | Status: DC | PRN

## 2018-03-15 MED ORDER — ACETAMINOPHEN 325 MG PO TABS
325.0000 mg | ORAL_TABLET | Freq: Four times a day (QID) | ORAL | Status: DC | PRN
  Administered 2018-03-16: 650 mg via ORAL
  Filled 2018-03-15: qty 2

## 2018-03-15 MED ORDER — MIDAZOLAM HCL 2 MG/2ML IJ SOLN
INTRAMUSCULAR | Status: AC
  Filled 2018-03-15: qty 2

## 2018-03-15 MED ORDER — METHOCARBAMOL 500 MG PO TABS: 500.0000 mg | ORAL_TABLET | Freq: Four times a day (QID) | ORAL | 1 refills | Status: DC | PRN

## 2018-03-15 MED ORDER — MAGNESIUM CITRATE PO SOLN: 1.0000 | Freq: Once | ORAL | Status: DC | PRN

## 2018-03-15 MED ORDER — ONDANSETRON HCL 4 MG/2ML IJ SOLN
4.0000 mg | Freq: Four times a day (QID) | INTRAMUSCULAR | Status: DC | PRN
  Administered 2018-03-16: 4 mg via INTRAVENOUS
  Filled 2018-03-15 (×2): qty 2

## 2018-03-15 MED ORDER — CEFAZOLIN SODIUM-DEXTROSE 2-4 GM/100ML-% IV SOLN
2.0000 g | Freq: Four times a day (QID) | INTRAVENOUS | Status: AC
  Administered 2018-03-15 (×2): 2 g via INTRAVENOUS
  Filled 2018-03-15 (×2): qty 100

## 2018-03-15 MED ORDER — PHENYLEPHRINE 40 MCG/ML (10ML) SYRINGE FOR IV PUSH (FOR BLOOD PRESSURE SUPPORT)
PREFILLED_SYRINGE | INTRAVENOUS | Status: DC | PRN
  Administered 2018-03-15: 80 ug via INTRAVENOUS

## 2018-03-15 MED ORDER — PROPOFOL 10 MG/ML IV BOLUS
INTRAVENOUS | Status: AC
  Filled 2018-03-15: qty 20

## 2018-03-15 MED ORDER — METOCLOPRAMIDE HCL 5 MG PO TABS: 5.0000 mg | ORAL_TABLET | Freq: Three times a day (TID) | ORAL | Status: DC | PRN

## 2018-03-15 MED ORDER — SUCCINYLCHOLINE CHLORIDE 20 MG/ML IJ SOLN
INTRAMUSCULAR | Status: DC | PRN
  Administered 2018-03-15: 120 mg via INTRAVENOUS

## 2018-03-15 MED ORDER — FENTANYL CITRATE (PF) 100 MCG/2ML IJ SOLN
INTRAMUSCULAR | Status: AC
  Filled 2018-03-15: qty 2

## 2018-03-15 MED ORDER — SODIUM CHLORIDE 0.9 % IV SOLN
INTRAVENOUS | Status: AC
  Filled 2018-03-15: qty 500000

## 2018-03-15 MED ORDER — FENTANYL CITRATE (PF) 100 MCG/2ML IJ SOLN
INTRAMUSCULAR | Status: DC | PRN
  Administered 2018-03-15 (×3): 100 ug via INTRAVENOUS

## 2018-03-15 MED ORDER — ACETAMINOPHEN 10 MG/ML IV SOLN
1000.0000 mg | INTRAVENOUS | Status: AC
  Administered 2018-03-15: 1000 mg via INTRAVENOUS
  Filled 2018-03-15: qty 100

## 2018-03-15 MED ORDER — ASPIRIN EC 325 MG PO TBEC: 325.0000 mg | DELAYED_RELEASE_TABLET | Freq: Every day | ORAL | 3 refills | Status: DC

## 2018-03-15 MED ORDER — DOCUSATE SODIUM 100 MG PO CAPS: 100.0000 mg | ORAL_CAPSULE | Freq: Two times a day (BID) | ORAL | 2 refills | Status: AC

## 2018-03-15 MED ORDER — MIDAZOLAM HCL 5 MG/5ML IJ SOLN
INTRAMUSCULAR | Status: DC | PRN
  Administered 2018-03-15: 2 mg via INTRAVENOUS

## 2018-03-15 MED ORDER — METOCLOPRAMIDE HCL 5 MG/ML IJ SOLN: 5.0000 mg | Freq: Three times a day (TID) | INTRAMUSCULAR | Status: DC | PRN

## 2018-03-15 MED ORDER — POLYETHYLENE GLYCOL 3350 17 G PO PACK: 17.0000 g | PACK | Freq: Every day | ORAL | Status: DC | PRN

## 2018-03-15 MED ORDER — HYDROMORPHONE HCL 1 MG/ML IJ SOLN: 0.5000 mg | INTRAMUSCULAR | Status: DC | PRN

## 2018-03-15 MED ORDER — CEFAZOLIN SODIUM-DEXTROSE 2-4 GM/100ML-% IV SOLN
2.0000 g | INTRAVENOUS | Status: AC
  Administered 2018-03-15: 2 g via INTRAVENOUS
  Filled 2018-03-15: qty 100

## 2018-03-15 MED ORDER — OXYCODONE HCL 5 MG PO TABS: 5.0000 mg | ORAL_TABLET | Freq: Three times a day (TID) | ORAL | 0 refills | Status: DC | PRN

## 2018-03-15 MED ORDER — OXYCODONE HCL 5 MG PO TABS
10.0000 mg | ORAL_TABLET | ORAL | Status: DC | PRN
  Administered 2018-03-15 – 2018-03-17 (×10): 15 mg via ORAL
  Filled 2018-03-15 (×10): qty 3

## 2018-03-15 MED ORDER — ALUM & MAG HYDROXIDE-SIMETH 200-200-20 MG/5ML PO SUSP: 30.0000 mL | ORAL | Status: DC | PRN

## 2018-03-15 MED ORDER — OXYCODONE HCL 5 MG PO TABS
5.0000 mg | ORAL_TABLET | ORAL | Status: DC | PRN
  Administered 2018-03-15: 10 mg via ORAL
  Administered 2018-03-15: 5 mg via ORAL
  Filled 2018-03-15: qty 2

## 2018-03-15 MED ORDER — SODIUM CHLORIDE 0.9 % IV SOLN
INTRAVENOUS | Status: DC | PRN
  Administered 2018-03-15: 500 mL

## 2018-03-15 MED ORDER — BISACODYL 5 MG PO TBEC: 5.0000 mg | DELAYED_RELEASE_TABLET | Freq: Every day | ORAL | Status: DC | PRN

## 2018-03-15 MED ORDER — ONDANSETRON HCL 4 MG/2ML IJ SOLN
INTRAMUSCULAR | Status: DC | PRN
  Administered 2018-03-15: 4 mg via INTRAVENOUS

## 2018-03-15 MED ORDER — POLYETHYLENE GLYCOL 3350 17 G PO PACK: 17.0000 g | PACK | Freq: Every day | ORAL | 0 refills | Status: DC

## 2018-03-15 MED ORDER — HYDROMORPHONE HCL 1 MG/ML IJ SOLN
INTRAMUSCULAR | Status: AC
  Administered 2018-03-15: 0.25 mg via INTRAVENOUS
  Filled 2018-03-15: qty 1

## 2018-03-15 MED ORDER — METHOCARBAMOL 1000 MG/10ML IJ SOLN
500.0000 mg | Freq: Four times a day (QID) | INTRAVENOUS | Status: DC | PRN
  Administered 2018-03-15: 500 mg via INTRAVENOUS
  Filled 2018-03-15: qty 550

## 2018-03-15 MED ORDER — HYDROMORPHONE HCL 1 MG/ML IJ SOLN
0.2500 mg | INTRAMUSCULAR | Status: DC | PRN
  Administered 2018-03-15 (×2): 0.25 mg via INTRAVENOUS
  Administered 2018-03-15: 0.5 mg via INTRAVENOUS

## 2018-03-15 MED ORDER — KCL IN DEXTROSE-NACL 20-5-0.45 MEQ/L-%-% IV SOLN
INTRAVENOUS | Status: AC
  Administered 2018-03-15: 16:00:00 via INTRAVENOUS
  Filled 2018-03-15 (×2): qty 1000

## 2018-03-15 MED ORDER — STERILE WATER FOR IRRIGATION IR SOLN
Status: DC | PRN
  Administered 2018-03-15: 2000 mL

## 2018-03-15 MED ORDER — DOCUSATE SODIUM 100 MG PO CAPS
100.0000 mg | ORAL_CAPSULE | Freq: Two times a day (BID) | ORAL | Status: DC
  Administered 2018-03-15 – 2018-03-17 (×4): 100 mg via ORAL
  Filled 2018-03-15 (×4): qty 1

## 2018-03-15 MED ORDER — LACTATED RINGERS IV SOLN
INTRAVENOUS | Status: DC
  Administered 2018-03-15 (×2): via INTRAVENOUS

## 2018-03-15 MED ORDER — PROPOFOL 10 MG/ML IV BOLUS
INTRAVENOUS | Status: DC | PRN
  Administered 2018-03-15: 20 mg via INTRAVENOUS
  Administered 2018-03-15: 60 mg via INTRAVENOUS
  Administered 2018-03-15: 100 mg via INTRAVENOUS
  Administered 2018-03-15: 180 mg via INTRAVENOUS

## 2018-03-15 MED ORDER — PHENOL 1.4 % MT LIQD: 1.0000 | OROMUCOSAL | Status: DC | PRN

## 2018-03-15 MED ORDER — BUPIVACAINE-EPINEPHRINE (PF) 0.5% -1:200000 IJ SOLN
INTRAMUSCULAR | Status: AC
  Filled 2018-03-15: qty 60

## 2018-03-15 MED ORDER — BUPIVACAINE-EPINEPHRINE 0.5% -1:200000 IJ SOLN
INTRAMUSCULAR | Status: DC | PRN
  Administered 2018-03-15: 20 mL

## 2018-03-15 MED ORDER — ASPIRIN EC 325 MG PO TBEC
325.0000 mg | DELAYED_RELEASE_TABLET | Freq: Two times a day (BID) | ORAL | Status: DC
  Administered 2018-03-16 – 2018-03-17 (×3): 325 mg via ORAL
  Filled 2018-03-15 (×3): qty 1

## 2018-03-15 MED ORDER — GABAPENTIN 300 MG PO CAPS
300.0000 mg | ORAL_CAPSULE | Freq: Three times a day (TID) | ORAL | Status: DC
  Administered 2018-03-15 – 2018-03-17 (×6): 300 mg via ORAL
  Filled 2018-03-15 (×6): qty 1

## 2018-03-15 MED ORDER — MENTHOL 3 MG MT LOZG: 1.0000 | LOZENGE | OROMUCOSAL | Status: DC | PRN

## 2018-03-15 MED ORDER — OXYCODONE HCL 5 MG PO TABS
ORAL_TABLET | ORAL | Status: AC
  Filled 2018-03-15: qty 1

## 2018-03-15 MED ORDER — LIDOCAINE 2% (20 MG/ML) 5 ML SYRINGE
INTRAMUSCULAR | Status: DC | PRN
  Administered 2018-03-15: 60 mg via INTRAVENOUS

## 2018-03-15 MED ORDER — ACETAMINOPHEN 500 MG PO TABS
1000.0000 mg | ORAL_TABLET | Freq: Four times a day (QID) | ORAL | Status: AC
  Administered 2018-03-15 – 2018-03-16 (×4): 1000 mg via ORAL
  Filled 2018-03-15 (×4): qty 2

## 2018-03-15 MED ORDER — SODIUM CHLORIDE 0.9 % IR SOLN
Status: DC | PRN
  Administered 2018-03-15: 2000 mL

## 2018-03-15 MED ORDER — DIPHENHYDRAMINE HCL 12.5 MG/5ML PO ELIX: 12.5000 mg | ORAL_SOLUTION | ORAL | Status: DC | PRN

## 2018-03-15 MED ORDER — ASPIRIN 325 MG PO TBEC: 325.0000 mg | DELAYED_RELEASE_TABLET | Freq: Two times a day (BID) | ORAL | 0 refills | Status: DC

## 2018-03-15 SURGICAL SUPPLY — 83 items
AGENT HMST SPONGE THK3/8 (HEMOSTASIS)
BAG SPEC THK2 15X12 ZIP CLS (MISCELLANEOUS) ×1
BAG ZIPLOCK 12X15 (MISCELLANEOUS) ×2 IMPLANT
BANDAGE ACE 4X5 VEL STRL LF (GAUZE/BANDAGES/DRESSINGS) ×2 IMPLANT
BANDAGE ACE 6X5 VEL STRL LF (GAUZE/BANDAGES/DRESSINGS) ×2 IMPLANT
BLADE SAG 18X100X1.27 (BLADE) ×2 IMPLANT
BLADE SAW SGTL 11.0X1.19X90.0M (BLADE) ×2 IMPLANT
BLADE SAW SGTL 13.0X1.19X90.0M (BLADE) ×2 IMPLANT
BNDG GAUZE ELAST 4 BULKY (GAUZE/BANDAGES/DRESSINGS) ×1 IMPLANT
CAPT KNEE TOTAL 3 ATTUNE ×1 IMPLANT
CEMENT HV SMART SET (Cement) ×4 IMPLANT
CHLORAPREP W/TINT 26ML (MISCELLANEOUS) IMPLANT
CLOTH 2% CHLOROHEXIDINE 3PK (PERSONAL CARE ITEMS) ×2 IMPLANT
COVER SURGICAL LIGHT HANDLE (MISCELLANEOUS) ×2 IMPLANT
CUFF TOURN SGL QUICK 34 (TOURNIQUET CUFF) ×2
CUFF TRNQT CYL 34X4X40X1 (TOURNIQUET CUFF) ×1 IMPLANT
DECANTER SPIKE VIAL GLASS SM (MISCELLANEOUS) ×2 IMPLANT
DRAPE INCISE IOBAN 66X45 STRL (DRAPES) IMPLANT
DRAPE LG THREE QUARTER DISP (DRAPES) ×1 IMPLANT
DRAPE ORTHO SPLIT 77X108 STRL (DRAPES) ×4
DRAPE SHEET LG 3/4 BI-LAMINATE (DRAPES) ×4 IMPLANT
DRAPE STERI IOBAN 125X83 (DRAPES) ×2 IMPLANT
DRAPE SURG ORHT 6 SPLT 77X108 (DRAPES) ×2 IMPLANT
DRAPE TOP 10253 STERILE (DRAPES) IMPLANT
DRAPE U-SHAPE 47X51 STRL (DRAPES) ×2 IMPLANT
DRSG AQUACEL AG ADV 3.5X10 (GAUZE/BANDAGES/DRESSINGS) ×1 IMPLANT
DRSG EMULSION OIL 3X16 NADH (GAUZE/BANDAGES/DRESSINGS) ×1 IMPLANT
DRSG PAD ABDOMINAL 8X10 ST (GAUZE/BANDAGES/DRESSINGS) ×1 IMPLANT
DRSG TEGADERM 4X4.75 (GAUZE/BANDAGES/DRESSINGS) IMPLANT
DURAPREP 26ML APPLICATOR (WOUND CARE) ×2 IMPLANT
ELECT BLADE TIP CTD 4 INCH (ELECTRODE) ×2 IMPLANT
ELECT REM PT RETURN 15FT ADLT (MISCELLANEOUS) ×2 IMPLANT
EVACUATOR 1/8 PVC DRAIN (DRAIN) IMPLANT
GAUZE SPONGE 2X2 8PLY STRL LF (GAUZE/BANDAGES/DRESSINGS) IMPLANT
GAUZE SPONGE 4X4 12PLY STRL (GAUZE/BANDAGES/DRESSINGS) ×1 IMPLANT
GLOVE BIOGEL PI IND STRL 6.5 (GLOVE) ×2 IMPLANT
GLOVE BIOGEL PI IND STRL 7.0 (GLOVE) ×1 IMPLANT
GLOVE BIOGEL PI IND STRL 8 (GLOVE) ×1 IMPLANT
GLOVE BIOGEL PI INDICATOR 6.5 (GLOVE)
GLOVE BIOGEL PI INDICATOR 7.0 (GLOVE) ×1
GLOVE BIOGEL PI INDICATOR 8 (GLOVE) ×1
GLOVE ECLIPSE 8.0 STRL XLNG CF (GLOVE) ×1 IMPLANT
GLOVE SURG SS PI 6.5 STRL IVOR (GLOVE) ×1 IMPLANT
GLOVE SURG SS PI 7.0 STRL IVOR (GLOVE) ×2 IMPLANT
GLOVE SURG SS PI 7.5 STRL IVOR (GLOVE) ×1 IMPLANT
GLOVE SURG SS PI 8.0 STRL IVOR (GLOVE) ×4 IMPLANT
GOWN STRL REUS W/TWL LRG LVL3 (GOWN DISPOSABLE) ×1 IMPLANT
GOWN STRL REUS W/TWL XL LVL3 (GOWN DISPOSABLE) ×4 IMPLANT
HANDPIECE INTERPULSE COAX TIP (DISPOSABLE) ×4
HEMOSTAT SPONGE AVITENE ULTRA (HEMOSTASIS) ×1 IMPLANT
IMMOBILIZER KNEE 20 (SOFTGOODS) ×2
IMMOBILIZER KNEE 20 THIGH 36 (SOFTGOODS) ×1 IMPLANT
KIT BASIN OR (CUSTOM PROCEDURE TRAY) ×1 IMPLANT
MANIFOLD NEPTUNE II (INSTRUMENTS) ×2 IMPLANT
NS IRRIG 1000ML POUR BTL (IV SOLUTION) ×1 IMPLANT
PACK GENERAL/GYN (CUSTOM PROCEDURE TRAY) ×1 IMPLANT
PACK TOTAL JOINT (CUSTOM PROCEDURE TRAY) ×1 IMPLANT
PACK TOTAL KNEE CUSTOM (KITS) ×2 IMPLANT
PADDING CAST COTTON 6X4 STRL (CAST SUPPLIES) ×1 IMPLANT
POSITIONER SURGICAL ARM (MISCELLANEOUS) ×2 IMPLANT
SEALER BIPOLAR AQUA 6.0 (INSTRUMENTS) ×1 IMPLANT
SET HNDPC FAN SPRY TIP SCT (DISPOSABLE) ×1 IMPLANT
SPONGE GAUZE 2X2 STER 10/PKG (GAUZE/BANDAGES/DRESSINGS)
SPONGE LAP 18X18 X RAY DECT (DISPOSABLE) ×1 IMPLANT
SPONGE SURGIFOAM ABS GEL 100 (HEMOSTASIS) IMPLANT
STAPLER VISISTAT (STAPLE) ×1 IMPLANT
STAPLER VISISTAT 35W (STAPLE) IMPLANT
STRIP CLOSURE SKIN 1/2X4 (GAUZE/BANDAGES/DRESSINGS) IMPLANT
SUT BONE WAX W31G (SUTURE) ×1 IMPLANT
SUT MNCRL AB 4-0 PS2 18 (SUTURE) ×1 IMPLANT
SUT STRATAFIX 0 PDS 27 VIOLET (SUTURE) ×2
SUT VIC AB 1 CT1 27 (SUTURE) ×6
SUT VIC AB 1 CT1 27XBRD ANTBC (SUTURE) ×2 IMPLANT
SUT VIC AB 2-0 CT1 27 (SUTURE) ×6
SUT VIC AB 2-0 CT1 TAPERPNT 27 (SUTURE) ×3 IMPLANT
SUTURE STRATFX 0 PDS 27 VIOLET (SUTURE) ×1 IMPLANT
SYR 50ML LL SCALE MARK (SYRINGE) IMPLANT
TIP HIGH FLOW IRRIGATION COAX (MISCELLANEOUS) ×1 IMPLANT
TOWER CARTRIDGE SMART MIX (DISPOSABLE) ×2 IMPLANT
TRAY FOLEY W/METER SILVER 16FR (SET/KITS/TRAYS/PACK) ×2 IMPLANT
WATER STERILE IRR 1000ML POUR (IV SOLUTION) ×3 IMPLANT
WRAP KNEE MAXI GEL POST OP (GAUZE/BANDAGES/DRESSINGS) ×2 IMPLANT
YANKAUER SUCT BULB TIP 10FT TU (MISCELLANEOUS) ×2 IMPLANT

## 2018-03-15 NOTE — Anesthesia Procedure Notes (Signed)
Anesthesia Regional Block: Adductor canal block   Pre-Anesthetic Checklist: ,, timeout performed, Correct Patient, Correct Site, Correct Laterality, Correct Procedure, Correct Position, site marked, Risks and benefits discussed,  Surgical consent,  Pre-op evaluation,  At surgeon's request and post-op pain management  Laterality: Left  Prep: chloraprep       Needles:   Needle Type: Stimulator Needle - 80          Additional Needles:   Procedures: Doppler guided,,,, ultrasound used (permanent image in chart),,,,  Narrative:  Start time: 03/15/2018 7:00 AM End time: 03/15/2018 7:15 AM  Performed by: Personally  Anesthesiologist: Dorris SinghGreen, Delanie Tirrell, MD

## 2018-03-15 NOTE — Progress Notes (Signed)
CSW consult-SNF  Plan: Follow surgeon's recommendation for DC plan and follow-up therapies(pt reports HHPT and then OP PT)  CSW will sign off. Please re consult if needs arise.   Vivi BarrackNicole Lawrance Wiedemann, Theresia MajorsLCSWA, MSW Clinical Social Worker  [phone] 03/15/2018  3:46 PM

## 2018-03-15 NOTE — Interval H&P Note (Signed)
History and Physical Interval Note:  03/15/2018 7:14 AM  Xavier Blevins  has presented today for surgery, with the diagnosis of Left knee post-traumatic degenerative joint disease , retained hardware  The various methods of treatment have been discussed with the patient and family. After consideration of risks, benefits and other options for treatment, the patient has consented to  Procedure(s): LEFT TOTAL KNEE ARTHROPLASTY (Left) HARDWARE REMOVAL (Left) as a surgical intervention .  The patient's history has been reviewed, patient examined, no change in status, stable for surgery.  I have reviewed the patient's chart and labs.  Questions were answered to the patient's satisfaction.     Toluwani Yadav C

## 2018-03-15 NOTE — Anesthesia Procedure Notes (Signed)
Anesthesia Regional Block: Narrative:       

## 2018-03-15 NOTE — Anesthesia Preprocedure Evaluation (Addendum)
Anesthesia Evaluation  Patient identified by MRN, date of birth, ID band Patient awake    Reviewed: Allergy & Precautions, NPO status , Patient's Chart, lab work & pertinent test results  Airway Mallampati: II  TM Distance: >3 FB     Dental   Pulmonary neg pulmonary ROS,    breath sounds clear to auscultation       Cardiovascular negative cardio ROS   Rhythm:Regular Rate:Normal     Neuro/Psych    GI/Hepatic negative GI ROS, Neg liver ROS,   Endo/Other  Hyperthyroidism   Renal/GU negative Renal ROS     Musculoskeletal   Abdominal   Peds  Hematology   Anesthesia Other Findings   Reproductive/Obstetrics                             Anesthesia Physical Anesthesia Plan  ASA: III  Anesthesia Plan: General   Post-op Pain Management:  Regional for Post-op pain   Induction: Intravenous  PONV Risk Score and Plan: 1 and Treatment may vary due to age or medical condition, Ondansetron, Dexamethasone and Midazolam  Airway Management Planned: Oral ETT  Additional Equipment:   Intra-op Plan:   Post-operative Plan: Extubation in OR  Informed Consent: I have reviewed the patients History and Physical, chart, labs and discussed the procedure including the risks, benefits and alternatives for the proposed anesthesia with the patient or authorized representative who has indicated his/her understanding and acceptance.   Dental advisory given  Plan Discussed with: CRNA, Anesthesiologist and Surgeon  Anesthesia Plan Comments:       Anesthesia Quick Evaluation

## 2018-03-15 NOTE — Discharge Instructions (Signed)

## 2018-03-15 NOTE — Transfer of Care (Signed)
Immediate Anesthesia Transfer of Care Note  Patient: Sherlie BanDaniel Vantil  Procedure(s) Performed: LEFT TOTAL KNEE ARTHROPLASTY with one screw removed, bone graft to tibial (Left Knee) HARDWARE REMOVAL (Left Knee)  Patient Location: PACU  Anesthesia Type:General  Level of Consciousness: awake, alert , oriented and patient cooperative  Airway & Oxygen Therapy: Patient Spontanous Breathing and Patient connected to face mask oxygen  Post-op Assessment: Report given to RN, Post -op Vital signs reviewed and stable and Patient moving all extremities  Post vital signs: Reviewed and stable  Last Vitals:  Vitals Value Taken Time  BP    Temp    Pulse 98 03/15/2018 10:45 AM  Resp 13 03/15/2018 10:45 AM  SpO2 100 % 03/15/2018 10:45 AM  Vitals shown include unvalidated device data.  Last Pain:  Vitals:   03/15/18 0617  TempSrc:   PainSc: 0-No pain         Complications: No apparent anesthesia complications

## 2018-03-15 NOTE — Anesthesia Procedure Notes (Signed)
Procedure Name: Intubation Performed by: Gean Maidens, CRNA Pre-anesthesia Checklist: Patient identified, Emergency Drugs available, Suction available, Patient being monitored and Timeout performed Patient Re-evaluated:Patient Re-evaluated prior to induction Oxygen Delivery Method: Circle system utilized Preoxygenation: Pre-oxygenation with 100% oxygen Induction Type: IV induction Ventilation: Mask ventilation without difficulty Laryngoscope Size: Mac and 4 Grade View: Grade I Tube type: Oral Tube size: 7.0 mm Number of attempts: 1 Airway Equipment and Method: Stylet Placement Confirmation: ETT inserted through vocal cords under direct vision,  positive ETCO2,  CO2 detector and breath sounds checked- equal and bilateral Secured at: 23 cm Tube secured with: Tape Dental Injury: Teeth and Oropharynx as per pre-operative assessment

## 2018-03-15 NOTE — Anesthesia Postprocedure Evaluation (Signed)
Anesthesia Post Note  Patient: Xavier Blevins  Procedure(s) Performed: LEFT TOTAL KNEE ARTHROPLASTY with one screw removed, bone graft to tibial (Left Knee) HARDWARE REMOVAL (Left Knee)     Patient location during evaluation: PACU Anesthesia Type: General Level of consciousness: awake Pain management: pain level controlled Vital Signs Assessment: post-procedure vital signs reviewed and stable Respiratory status: spontaneous breathing Cardiovascular status: stable Anesthetic complications: no    Last Vitals:  Vitals:   03/15/18 1417 03/15/18 1523  BP: (!) 154/97 (!) 150/93  Pulse: 100 (!) 101  Resp: 14 16  Temp: 36.6 C 36.8 C  SpO2: 99% 98%    Last Pain:  Vitals:   03/15/18 1523  TempSrc: Oral  PainSc:                  Xavier Blevins

## 2018-03-15 NOTE — Evaluation (Signed)
Physical Therapy Evaluation Patient Details Name: Xavier BanDaniel Blevins MRN: [id number] DOB: 12/08/1965 Today's Date: 03/15/2018   History of Present Illness  Pt is a 52 year old male s/p LEFT TOTAL KNEE ARTHROPLASTY with one screw removed, bone graft to tibial with hx of L ACL repair and revision in 1987-88  Clinical Impression  Pt is s/p TKA resulting in the deficits listed below (see PT Problem List).  Pt will benefit from skilled PT to increase their independence and safety with mobility to allow discharge to the venue listed below.  Pt assisted with short distance ambulation POD #0 and plans to d/c home with spouse and start with HHPT, then transition to OP PT.     Follow Up Recommendations Home health PT;Follow surgeon's recommendation for DC plan and follow-up therapies(pt reports HHPT and then OP PT)    Equipment Recommendations  Rolling walker with 5" wheels    Recommendations for Other Services       Precautions / Restrictions Precautions Precautions: Knee Required Braces or Orthoses: Knee Immobilizer - Left Restrictions Other Position/Activity Restrictions: WBAT      Mobility  Bed Mobility Overal bed mobility: Needs Assistance Bed Mobility: Supine to Sit;Sit to Supine     Supine to sit: Min guard Sit to supine: Min guard   General bed mobility comments: verbal cues for self assist  Transfers Overall transfer level: Needs assistance Equipment used: Rolling walker (2 wheeled) Transfers: Sit to/from Stand Sit to Stand: Min assist         General transfer comment: verbal cues for UE and LE positioning, assist to rise and steady  Ambulation/Gait Ambulation/Gait assistance: Min guard Ambulation Distance (Feet): 50 Feet Assistive device: Rolling walker (2 wheeled) Gait Pattern/deviations: Step-to pattern;Decreased stance time - left;Antalgic     General Gait Details: verbal cues for sequence, RW positioning, step length  Stairs            Wheelchair  Mobility    Modified Rankin (Stroke Patients Only)       Balance                                             Pertinent Vitals/Pain Pain Assessment: 0-10 Pain Score: 2  Pain Location: L knee Pain Descriptors / Indicators: Sore;Aching Pain Intervention(s): Repositioned;Limited activity within patient's tolerance;Monitored during session;Ice applied    Home Living Family/patient expects to be discharged to:: Private residence Living Arrangements: Spouse/significant other;Children Available Help at Discharge: Family Type of Home: House Home Access: Stairs to enter   Secretary/administratorntrance Stairs-Number of Steps: 1 Home Layout: Two level Home Equipment: Crutches      Prior Function Level of Independence: Independent               Hand Dominance        Extremity/Trunk Assessment        Lower Extremity Assessment Lower Extremity Assessment: LLE deficits/detail LLE Deficits / Details: unable to perform SLR, ROM TBA however pt reports he only had approximately 90* L knee flexion prior to surgery (limited flexion reported chronic since his ACL repair)       Communication   Communication: No difficulties  Cognition Arousal/Alertness: Awake/alert Behavior During Therapy: WFL for tasks assessed/performed Overall Cognitive Status: Within Functional Limits for tasks assessed  General Comments      Exercises     Assessment/Plan    PT Assessment Patient needs continued PT services  PT Problem List Decreased strength;Decreased mobility;Decreased range of motion;Decreased knowledge of use of DME;Pain;Decreased knowledge of precautions       PT Treatment Interventions Stair training;Functional mobility training;Therapeutic exercise;Gait training;Patient/family education;DME instruction;Therapeutic activities    PT Goals (Current goals can be found in the Care Plan section)  Acute Rehab PT Goals PT  Goal Formulation: With patient Time For Goal Achievement: 03/22/18 Potential to Achieve Goals: Good    Frequency 7X/week   Barriers to discharge        Co-evaluation               AM-PAC PT "6 Clicks" Daily Activity  Outcome Measure Difficulty turning over in bed (including adjusting bedclothes, sheets and blankets)?: A Little Difficulty moving from lying on back to sitting on the side of the bed? : A Lot Difficulty sitting down on and standing up from a chair with arms (e.g., wheelchair, bedside commode, etc,.)?: A Lot Help needed moving to and from a bed to chair (including a wheelchair)?: A Little Help needed walking in hospital room?: A Little Help needed climbing 3-5 steps with a railing? : A Lot 6 Click Score: 15    End of Session Equipment Utilized During Treatment: Gait belt;Left knee immobilizer Activity Tolerance: Patient tolerated treatment well Patient left: in bed;with bed alarm set;with call bell/phone within reach;with family/visitor present   PT Visit Diagnosis: Other abnormalities of gait and mobility (R26.89)    Time: 1422-1440 PT Time Calculation (min) (ACUTE ONLY): 18 min   Charges:   PT Evaluation $PT Eval Low Complexity: 1 Low     PT G CodesZenovia Jarred, PT, DPT 03/15/2018 Pager: [phone] Maida Sale E 03/15/2018, 2:50 PM

## 2018-03-15 NOTE — Brief Op Note (Signed)
03/15/2018  10:23 AM  PATIENT:  Xavier Blevins  52 y.o. male  PRE-OPERATIVE DIAGNOSIS:  Left knee post-traumatic degenerative joint disease , retained hardware  POST-OPERATIVE DIAGNOSIS:  Left knee post-traumatic degenerative joint disease , retained hardware  PROCEDURE:  Procedure(s) with comments: LEFT TOTAL KNEE ARTHROPLASTY with one screw removed, bone graft to tibial (Left) - with block. One screw to patient HARDWARE REMOVAL (Left) - with block  SURGEON:  Surgeon(s) and Role:    * Jene EveryBeane, Seini Lannom, MD - Primary  PHYSICIAN ASSISTANT:   ASSISTANTS: B[place]   ANESTHESIA:   general  EBL:  150 mL   BLOOD ADMINISTERED:none  DRAINS: none   LOCAL MEDICATIONS USED:  MARCAINE     SPECIMEN:  No Specimen  DISPOSITION OF SPECIMEN:  PATHOLOGY  COUNTS:  YES  TOURNIQUET:   Total Tourniquet Time Documented: Thigh (Left) - 99 minutes Total: Thigh (Left) - 99 minutes   DICTATION: .Other Dictation: Dictation Number   [id number]  PLAN OF CARE: Admit to inpatient   PATIENT DISPOSITION:  PACU - hemodynamically stable.   Delay start of Pharmacological VTE agent (>24hrs) due to surgical blood loss or risk of bleeding: no

## 2018-03-15 NOTE — Plan of Care (Signed)
Reviewed plan of care with patient.  Pain controlled at tolerable level.  Call bell and bedside table within reach.  Low bed; no falls.

## 2018-03-16 LAB — BASIC METABOLIC PANEL
ANION GAP: 6 (ref 5–15)
BUN: 13 mg/dL (ref 6–20)
CO2: 24 mmol/L (ref 22–32)
Calcium: 7.6 mg/dL — ABNORMAL LOW (ref 8.9–10.3)
Chloride: 106 mmol/L (ref 101–111)
Creatinine, Ser: 0.79 mg/dL (ref 0.61–1.24)
GFR calc Af Amer: >60 mL/min (ref >60)
GFR calc non Af Amer: >60 mL/min (ref >60)
Glucose, Bld: 130 mg/dL — ABNORMAL HIGH (ref 65–99)
POTASSIUM: 3.7 mmol/L (ref 3.5–5.1)
SODIUM: 136 mmol/L (ref 135–145)

## 2018-03-16 LAB — CBC
HCT: 32.6 % — ABNORMAL LOW (ref 39.0–52.0)
Hemoglobin: 10.3 g/dL — ABNORMAL LOW (ref 13.0–17.0)
MCH: 25.4 pg — ABNORMAL LOW (ref 26.0–34.0)
MCHC: 31.6 g/dL (ref 30.0–36.0)
MCV: 80.5 fL (ref 78.0–100.0)
Platelets: 162 10*3/uL (ref 150–400)
RBC: 4.05 MIL/uL — AB (ref 4.22–5.81)
RDW: 16.8 % — ABNORMAL HIGH (ref 11.5–15.5)
WBC: 8.9 10*3/uL (ref 4.0–10.5)

## 2018-03-16 NOTE — Progress Notes (Signed)
Physical Therapy Treatment Patient Details Name: Xavier Blevins MRN: [id number] DOB: 1966/09/04 Today's Date: 03/16/2018    History of Present Illness Pt is a 52 year old male s/p LEFT TOTAL KNEE ARTHROPLASTY with one screw removed, bone graft to tibial with hx of L ACL repair and revision in 1987-88    PT Comments    Pt assisted with ambulating in hallway and performing LE exercises.  Ice applied end of session.  Pt reports plan for d/c home tomorrow.  Follow Up Recommendations  Home health PT;Follow surgeon's recommendation for DC plan and follow-up therapies     Equipment Recommendations  Rolling walker with 5" wheels    Recommendations for Other Services       Precautions / Restrictions Precautions Precautions: Knee Required Braces or Orthoses: Knee Immobilizer - Left Restrictions Other Position/Activity Restrictions: WBAT    Mobility  Bed Mobility Overal bed mobility: Needs Assistance Bed Mobility: Supine to Sit     Supine to sit: Min assist     General bed mobility comments: verbal cues for technique, slight assist for L LE  Transfers Overall transfer level: Needs assistance Equipment used: Rolling walker (2 wheeled) Transfers: Sit to/from Stand Sit to Stand: Min guard         General transfer comment: verbal cues for UE and LE positioning  Ambulation/Gait Ambulation/Gait assistance: Min guard Ambulation Distance (Feet): 140 Feet Assistive device: Rolling walker (2 wheeled) Gait Pattern/deviations: Step-to pattern;Decreased stance time - left;Antalgic     General Gait Details: verbal cues for sequence, RW positioning, step length   Stairs             Wheelchair Mobility    Modified Rankin (Stroke Patients Only)       Balance                                            Cognition Arousal/Alertness: Awake/alert Behavior During Therapy: WFL for tasks assessed/performed Overall Cognitive Status: Within Functional  Limits for tasks assessed                                        Exercises Total Joint Exercises Ankle Circles/Pumps: AROM;Both;10 reps Quad Sets: AROM;10 reps;Both Short Arc Quad: 10 reps;AAROM;Left Heel Slides: AAROM;10 reps;Seated;Left Hip ABduction/ADduction: AROM;Left Straight Leg Raises: AAROM;10 reps;Left Goniometric ROM: approximately 75* AAROM L knee flexion sitting    General Comments        Pertinent Vitals/Pain Pain Assessment: 0-10 Pain Score: 4  Pain Location: L knee Pain Descriptors / Indicators: Sore;Aching Pain Intervention(s): Limited activity within patient's tolerance;Repositioned;Ice applied;Monitored during session    Home Living                      Prior Function            PT Goals (current goals can now be found in the care plan section) Progress towards PT goals: Progressing toward goals    Frequency    7X/week      PT Plan Current plan remains appropriate    Co-evaluation              AM-PAC PT "6 Clicks" Daily Activity  Outcome Measure  Difficulty turning over in bed (including adjusting bedclothes, sheets and blankets)?: A Little Difficulty moving from  lying on back to sitting on the side of the bed? : A Little Difficulty sitting down on and standing up from a chair with arms (e.g., wheelchair, bedside commode, etc,.)?: A Little Help needed moving to and from a bed to chair (including a wheelchair)?: A Little Help needed walking in hospital room?: A Little Help needed climbing 3-5 steps with a railing? : A Little 6 Click Score: 18    End of Session Equipment Utilized During Treatment: Gait belt Activity Tolerance: Patient tolerated treatment well Patient left: with call bell/phone within reach;in chair   PT Visit Diagnosis: Other abnormalities of gait and mobility (R26.89)     Time: 8119-1478 PT Time Calculation (min) (ACUTE ONLY): 24 min  Charges:  $Gait Training: 8-22 mins $Therapeutic  Exercise: 8-22 mins                    G Codes:      Zenovia Jarred, PT, DPT 03/16/2018 Pager: [phone]   Maida Sale E 03/16/2018, 1:16 PM

## 2018-03-16 NOTE — Op Note (Signed)
NAME:  Xavier Blevins, Xavier Blevins              ACCOUNT NO.:  [id number]  MEDICAL RECORD NO.:  [id number]  LOCATION:                                 FACILITY:  PHYSICIAN:  Jene EveryJeffrey Hyla Coard, M.D.         DATE OF BIRTH:  DATE OF PROCEDURE:  03/15/2018 DATE OF DISCHARGE:                              OPERATIVE REPORT   PREOPERATIVE DIAGNOSES: 1. End-stage osteoarthritis of the left knee, posttraumatic. 2. Retained hardware from an anterior cruciate ligament     reconstruction.  POSTOPERATIVE DIAGNOSES: 1. End-stage osteoarthritis of the left knee, posttraumatic. 2. Retained hardware from an anterior cruciate ligament     reconstruction.  PROCEDURES PERFORMED: 1. Left total knee arthroplasty utilizing the DePuy Attune 8 femur, 8     tibia, 6 insert, 41 patella. 2. Removal of hardware, left knee tibial screws. 3. Bone grafting of tibial defect.  ANESTHESIA:  General.  ASSISTANT:  Andrez GrimeJaclyn Bissell, PA.  Technical difficulty increased due to the patient's previous ACL surgery, retained hardware, scar tissue.  HISTORY:  A 52 year old, end-stage osteoarthrosis, medial compartment, bone-on-bone, posttraumatic.  Previous ACL reconstruction with a tibial screw, femoral screw and staple.  He was indicated for replacement of the degenerated joint.  Risks and benefits discussed including bleeding, infection, damage to the neurovascular structures, no change in symptoms, worsening symptoms, DVT, PE, anesthetic complications, etc. Also discussed the possibility, inability to remove the hardware as well.  The patient had a previous medial incision.  After discussion with colleagues, it had been over 24 years since his ACL reconstruction, and we therefore discussed a double incision, standard bisecting the patella.  As the medial scar would be too medial to gain adequate access laterally for hardware removal and for exposure.  We discussed the risks and benefits with the patient including bleeding,  infection, damage to the neurovascular structures, no change in symptoms, worsening symptoms, skin issues, suboptimal range of motion, need for revision, fracture, etc.  TECHNIQUE:  With the patient in supine position after induction of adequate general anesthesia, 2 g of Kefzol, left lower extremity had been prepped and draped in usual sterile fashion.  Exsanguinated the thigh tourniquet, 225.  Made a midline incision, however, slightly laterally 5 cm measured from his previous medial incision from proximal to distal.  Subcutaneous tissue was dissected.  Electrocautery was utilized to achieve hemostasis.  Full-thickness flaps developed.  Median parapatellar arthrotomy performed.  Soft tissue elevated medially along the tibia preserving the MCL.  Patella everted.  Scar tissue mobilized from beneath the patella.  Inferior portion of the fat pad excised. Patella everted.  Knee flexed.  Tricompartmental osteoarthrosis was noted both medially and laterally, and on the patella.  A Leksell rongeur was utilized to remove osteophytes.  A notch was placed in the superior aspect of the femoral notch.  Medially, there was no hardware visible.  The head of the tibia screw was noted on the medial side of the proximal tibia.  A step drill was utilized and the femoral canal was irrigated, a 5-degree left was used with 10 off the femur due to a flexion contracture.  This was pinned.  Distal femoral cut was then performed.  This was sized off the anterior cortex to an 8.  3 degrees of external rotation, this was pinned and I performed a box cut with anterior, posterior and chamfer cuts with a curved Crego protecting the soft tissues at all times.  The intramedullary guide was placed very carefully initially and I did not feel any obstruction or deflection from the femoral hardware and the intramedullary guide, and distal femoral cut was found to be appropriate by visual inspection.  Prior to the  placement of the internal guide, I palpated the internal portion of the femur with a straight curette and there was no breach on the cortex or palpable bone to the hardware.  Paid attention then towards the femur.  Two off the defect, which was medial, was 10 off the lateral side.  Bisecting tibiotalar joint, 3-degree slope.  Sclerotic bone was noted medially.  I performed a distal tibial cut and noted in the center of the tibial cut was the end of the tibial screw.  This would preclude a placement of the central shank of the tibial prosthesis and therefore felt it required removal.  After skeletonizing the head of the screw, it was noted that there was a hexagonal screw.  It appeared to be stripped as there was no screwdriver that was able to engage and backed that out. I then asked for a needle-nose driver, which was not available in the longer version.  To grab the distal portion of the screw hole, I hold the bone around that to have access to that distal point of the screw, this was then grabbed with a Beyer rongeur, counter rotated and backed the tibial screw out distally.  This was then removed in its entirety without breach of the cortex.  Distally, I placed bone wax over that aperture.  I curetted a cyst that was around the hardware, measured the tibial plateau to an 8 just to the medial aspect of the tibial tubercle. It was pinned and I centrally drilled it after harvesting some bone graft from the femur.  We then used our punch guide and then turned attention to completing the tibia.  It was bisected with a notch cut, slightly laterally for tracking.  After this cut was then performed, we used the trial femur, drilled the lug holes, placed a 6 insert and placed the trial femur, but there was slight un-seeding of femur, I had to revisit the posterior aspect of the femur with a curved Crego protecting the posterior aspect of the femoral condyle.  Used an osteotome to remove  residual osteophytes from both femoral condyles.  I then placed the femur back, impacted and it sit flush.  Placed a 6 insert, reduced it, and had full extension, full flexion, good stability, varus and valgus stressing 0-30 degrees, negative anterior drawer.  Measured the patella, was measured at 26, planed it to a 16, and following that, I finished it with hand cut to smooth off a sclerotic area.  Measured it to a 41 with the paddle parallel to the joint, drilled our PEG holes, placed our 41 insert, reduced it and an excellent patellofemoral tracking.  Then removed all prostheses.  At the end of the completion of the box cut, the very tip of the femoral screw was noted, but it was not obstructing the insertion of the prosthesis. The box had some sclerotic bone that was removed.  After all was removed, I used the laminar spreader and posterolaterally on the tibial plateau.  There was  an osteophyte projecting along the posterior capsule and front of the popliteus.  I wedged the curve Crego behind it and then removed this with an osteotome.  Capsule was intact.  Cauterized the geniculates.  Copiously irrigated with pulsatile lavage.  Flexed the knee dried surfaces thoroughly, mixed the cement on the back table in appropriate fashion, injected into the cement, digitally pressurizing it and then impacted the 8 tibial trays, redundant cement removed.  We cemented and impacted the femur after placing bone graft in the canal, redundant cement removed.  Placed a 6 trial insert, reduced it, had full extension at 0 degrees and axial load throughout the curing of the cement with redundant cement removed.  Cemented and impacted the patella.  Marcaine 0.25% with epinephrine was infiltrated in the periosteum.  Antibiotic irrigation placed in the wound.  We allowed for curing of the cement.  At this time, we let the tourniquet down to 99 minutes.  There was some generalized oozing over the synovium in  the area of the previous scar tissue.  Felt it was appropriate at this point in time to utilize the Donaldson.  This was then engaged and used the Aquamantys along the deep tissues along the patellar arthrotomy, the posterolateral aspect of the capsule after I removed the insert and down near the proximal tibia, after gaining strict hemostasis here.  We copiously irrigated with antibiotic irrigation.  Flexed the knee.  All redundant cement removed.  I placed a 6 insert, reduced it and had good stability at varus-valgus stressing at 0-30 degrees, negative anterior drawer.  Excellent patellofemoral tracking.  I then used antibiotic irrigation appropriately.  I took bone graft from the femoral cuts, fashioned them into small bone chips and bone grafted the tibia proximally where the screw had exited.  There was no breach in the cortex or fracture.  I then with slight flexion, reapproximated the patellar arthrotomy with #1 Vicryl interrupted figure-of-eight sutures and then oversewed it with a running Stratafix subcu after irrigation, 2- 0, and skin with staples.  Wound was dressed sterilely, there was good vascularization of the skin noted.  Flexion to gravity at 90 degrees. Excellent patellofemoral tracking, full range following closure.  The patient was extubated without difficulty and transported to the recovery room in satisfactory condition.  The patient tolerated the procedure well.  No complications.  Assistant, Andrez Grime, [place].  Blood loss 150 cc.     Jene Every, M.D.   ______________________________ Jene Every, M.D.    Cordelia Pen  D:  03/15/2018  T:  03/16/2018  Job:  [id number]

## 2018-03-16 NOTE — Progress Notes (Signed)
Physical Therapy Treatment Patient Details Name: Xavier BanDaniel Lafontaine MRN: [id number] DOB: Oct 13, 1966 Today's Date: 03/16/2018    History of Present Illness Pt is a 52 year old male s/p LEFT TOTAL KNEE ARTHROPLASTY with one screw removed, bone graft to tibial with hx of L ACL repair and revision in 1987-88    PT Comments    Pt assisted with ambulating in hallway again and performed knee flexion edge of bed prior to returning to supine.  Follow Up Recommendations  Home health PT;Follow surgeon's recommendation for DC plan and follow-up therapies     Equipment Recommendations  Rolling walker with 5" wheels    Recommendations for Other Services       Precautions / Restrictions Precautions Precautions: Knee Required Braces or Orthoses: Knee Immobilizer - Left Restrictions Other Position/Activity Restrictions: WBAT    Mobility  Bed Mobility Overal bed mobility: Needs Assistance Bed Mobility: Supine to Sit;Sit to Supine     Supine to sit: Min guard Sit to supine: Min guard   General bed mobility comments: verbal cues for self assist  Transfers Overall transfer level: Needs assistance Equipment used: Rolling walker (2 wheeled) Transfers: Sit to/from Stand Sit to Stand: Min guard         General transfer comment: verbal cues for UE and LE positioning  Ambulation/Gait Ambulation/Gait assistance: Min guard Ambulation Distance (Feet): 240 Feet Assistive device: Rolling walker (2 wheeled) Gait Pattern/deviations: Step-to pattern;Decreased stance time - left;Antalgic;Step-through pattern     General Gait Details: verbal cues for starting step through pattern, RW positioning, step length   Stairs             Wheelchair Mobility    Modified Rankin (Stroke Patients Only)       Balance                                            Cognition Arousal/Alertness: Awake/alert Behavior During Therapy: WFL for tasks assessed/performed Overall  Cognitive Status: Within Functional Limits for tasks assessed                                        Exercises Total Joint Exercises Heel Slides: AAROM;10 reps;Seated;Left    General Comments        Pertinent Vitals/Pain Pain Assessment: 0-10 Pain Score: 5  Pain Location: L knee Pain Descriptors / Indicators: Sore;Aching Pain Intervention(s): Limited activity within patient's tolerance;Repositioned;Monitored during session    Home Living                      Prior Function            PT Goals (current goals can now be found in the care plan section) Progress towards PT goals: Progressing toward goals    Frequency    7X/week      PT Plan Current plan remains appropriate    Co-evaluation              AM-PAC PT "6 Clicks" Daily Activity  Outcome Measure  Difficulty turning over in bed (including adjusting bedclothes, sheets and blankets)?: A Little Difficulty moving from lying on back to sitting on the side of the bed? : A Little Difficulty sitting down on and standing up from a chair with arms (e.g., wheelchair, bedside commode, etc,.)?:  A Little Help needed moving to and from a bed to chair (including a wheelchair)?: A Little Help needed walking in hospital room?: A Little Help needed climbing 3-5 steps with a railing? : A Little 6 Click Score: 18    End of Session Equipment Utilized During Treatment: Gait belt Activity Tolerance: Patient tolerated treatment well Patient left: with call bell/phone within reach;in bed;with family/visitor present   PT Visit Diagnosis: Other abnormalities of gait and mobility (R26.89)     Time: 1610-9604 PT Time Calculation (min) (ACUTE ONLY): 17 min  Charges:  $Gait Training: 8-22 mins                     G Codes:       Zenovia Jarred, PT, DPT 03/16/2018 Pager: [phone]  Maida Sale E 03/16/2018, 4:31 PM

## 2018-03-16 NOTE — Discharge Summary (Signed)
Physician Discharge Summary   Patient ID: Xavier Blevins MRN: [id number] DOB/AGE: 07/05/1966 52 y.o.  Admit date: 03/15/2018 Discharge date: 03/17/18  Primary Diagnosis: Left knee post-traumatic osteoarthritis  Admission Diagnoses:  Past Medical History:  Diagnosis Date  . Hyperthyroidism   . Hypogonadism male    Discharge Diagnoses:   Principal Problem:   Post-traumatic osteoarthritis of left knee Active Problems:   Left knee DJD  Estimated body mass index is 36.55 kg/m as calculated from the following:   Height as of this encounter: _0  (1.702 m).   Weight as of this encounter: 105.9 kg (233 lb 6 oz).  Procedure:  Procedure(s) (LRB): LEFT TOTAL KNEE ARTHROPLASTY with one screw removed, bone graft to tibial (Left) HARDWARE REMOVAL (Left)   Consults: None  HPI: see H&P Laboratory Data: Admission on 03/15/2018  Component Date Value Ref Range Status  . WBC 03/16/2018 8.9  4.0 - 10.5 K/uL Final  . RBC 03/16/2018 4.05* 4.22 - 5.81 MIL/uL Final  . Hemoglobin 03/16/2018 10.3* 13.0 - 17.0 g/dL Final  . HCT 03/16/2018 32.6* 39.0 - 52.0 % Final  . MCV 03/16/2018 80.5  78.0 - 100.0 fL Final  . MCH 03/16/2018 25.4* 26.0 - 34.0 pg Final  . MCHC 03/16/2018 31.6  30.0 - 36.0 g/dL Final  . RDW 03/16/2018 16.8* 11.5 - 15.5 % Final  . Platelets 03/16/2018 162  150 - 400 K/uL Final   Performed at [facility] [place] [address], [place], [place] [address]  . Sodium 03/16/2018 136  135 - 145 mmol/L Final  . Potassium 03/16/2018 3.7  3.5 - 5.1 mmol/L Final  . Chloride 03/16/2018 106  101 - 111 mmol/L Final  . CO2 03/16/2018 24  22 - 32 mmol/L Final  . Glucose, Bld 03/16/2018 130* 65 - 99 mg/dL Final  . BUN 03/16/2018 13  6 - 20 mg/dL Final  . Creatinine, Ser 03/16/2018 0.79  0.61 - 1.24 mg/dL Final  . Calcium 03/16/2018 7.6* 8.9 - 10.3 mg/dL Final  . GFR calc non Af Amer 03/16/2018 >60  >60 mL/min Final  . GFR calc Af Amer 03/16/2018 >60  >60 mL/min Final   Comment: (NOTE) The eGFR has been calculated using the CKD EPI equation. This calculation has not been validated in all clinical situations. eGFR's persistently <60 mL/min signify possible Chronic Kidney Disease.   Georgiann Hahn gap 03/16/2018 6  5 - 15 Final   Performed at [facility], [place] [address] [place], [place] [address]  Hospital Outpatient Visit on 03/06/2018  Component Date Value Ref Range Status  . aPTT 03/06/2018 31  24 - 36 seconds Final   Performed at [facility], [place] [address], [place], [place] [address]  . Sodium 03/06/2018 141  135 - 145 mmol/L Final  . Potassium 03/06/2018 3.9  3.5 - 5.1 mmol/L Final  . Chloride 03/06/2018 108  101 - 111 mmol/L Final  . CO2 03/06/2018 24  22 - 32 mmol/L Final  . Glucose, Bld 03/06/2018 86  65 - 99 mg/dL Final  . BUN 03/06/2018 19  6 - 20 mg/dL Final  . Creatinine, Ser 03/06/2018 0.88  0.61 - 1.24 mg/dL Final  . Calcium 03/06/2018 8.7* 8.9 - 10.3 mg/dL Final  . GFR calc non Af Amer 03/06/2018 >60  >60 mL/min Final  . GFR calc Af Amer 03/06/2018 >60  >60 mL/min Final   Comment: (NOTE) The eGFR has been calculated using the CKD EPI equation. This calculation has  not been validated in all clinical situations. eGFR's persistently <60 mL/min signify possible Chronic Kidney Disease.   Georgiann Hahn gap 03/06/2018 9  5 - 15 Final   Performed at [facility], [place] [address] [place], [place] [address]  . WBC 03/06/2018 5.0  4.0 - 10.5 K/uL Final  . RBC 03/06/2018 5.11  4.22 - 5.81 MIL/uL Final  . Hemoglobin 03/06/2018 13.0  13.0 - 17.0 g/dL Final  . HCT 03/06/2018 41.6  39.0 - 52.0 % Final  . MCV 03/06/2018 81.4  78.0 - 100.0 fL Final  . MCH 03/06/2018 25.4* 26.0 - 34.0 pg Final  . MCHC 03/06/2018 31.3  30.0 - 36.0 g/dL Final  . RDW 03/06/2018 16.7* 11.5 - 15.5 % Final  . Platelets 03/06/2018 203  150 - 400 K/uL Final   Performed at [facility], [place] [address],  [place], [place] [address]  . Prothrombin Time 03/06/2018 13.8  11.4 - 15.2 seconds Final  . INR 03/06/2018 1.07   Final   Performed at [facility], [place] [address], [place], [place] [address]  . Color, Urine 03/06/2018 YELLOW  YELLOW Final  . APPearance 03/06/2018 CLEAR  CLEAR Final  . Specific Gravity, Urine 03/06/2018 1.019  1.005 - 1.030 Final  . pH 03/06/2018 7.0  5.0 - 8.0 Final  . Glucose, UA 03/06/2018 NEGATIVE  NEGATIVE mg/dL Final  . Hgb urine dipstick 03/06/2018 NEGATIVE  NEGATIVE Final  . Bilirubin Urine 03/06/2018 NEGATIVE  NEGATIVE Final  . Ketones, ur 03/06/2018 NEGATIVE  NEGATIVE mg/dL Final  . Protein, ur 03/06/2018 NEGATIVE  NEGATIVE mg/dL Final  . Nitrite 03/06/2018 NEGATIVE  NEGATIVE Final  . Leukocytes, UA 03/06/2018 NEGATIVE  NEGATIVE Final   Performed at [place] [address], [place], [place] [address]  . MRSA, PCR 03/06/2018 NEGATIVE  NEGATIVE Final  . Staphylococcus aureus 03/06/2018 NEGATIVE  NEGATIVE Final   Comment: (NOTE) The Xpert SA Assay (FDA approved for NASAL specimens in patients 53 years of age and older), is one component of a comprehensive surveillance program. It is not intended to diagnose infection nor to guide or monitor treatment. Performed at [facility], [place] [address], [place], [place] [address]      X-Rays:Dg Knee Left Port  Result Date: 03/15/2018 CLINICAL DATA:  Status post arthroplasty. EXAM: PORTABLE LEFT KNEE - 1-2 VIEW COMPARISON:  None. FINDINGS: Status post LEFT total knee arthroplasty. Satisfactory position and alignment. Distal femoral screw and staple are noted along the lateral aspect. Air is seen in the soft tissues, postoperatively. IMPRESSION: No adverse features. Electronically Signed   By: Staci Righter M.D.   On: 03/15/2018 11:44    EKG:No orders found for this or any previous visit.   Hospital Course: Xavier Blevins is a 52 y.o. who was admitted to [facility]. They were brought to the operating room on 03/15/2018 and underwent Procedure(s): LEFT TOTAL KNEE ARTHROPLASTY with one screw removed, bone graft to tibial HARDWARE REMOVAL.  Patient tolerated the procedure well and was later transferred to the recovery room and then to the orthopaedic floor for postoperative care.  They were given PO and IV analgesics for pain control following their surgery.  They were given 24 hours of postoperative antibiotics of  Anti-infectives (From admission, onward)   Start     Dose/Rate Route Frequency Ordered Stop   03/15/18 1400  ceFAZolin (ANCEF) IVPB 2g/100 mL premix     2 g 200 mL/hr  over 30 Minutes Intravenous Every 6 hours 03/15/18 1110 03/15/18 2026   03/15/18 0817  polymyxin B 500,000 Units, bacitracin 50,000 Units in sodium chloride 0.9 % 500 mL irrigation  Status:  Discontinued       As needed 03/15/18 0817 03/15/18 1041   03/15/18 0544  ceFAZolin (ANCEF) IVPB 2g/100 mL premix     2 g 200 mL/hr over 30 Minutes Intravenous On call to O.R. 03/15/18 [id number] 03/15/18 0817     and started on DVT prophylaxis in the form of Aspirin, TED hose and SCDs.   PT and OT were ordered for total joint protocol.  Discharge planning consulted to help with postop disposition and equipment needs.  Patient had a good night on the evening of surgery.  They started to get up OOB with therapy on day one.  Continued to work with therapy into day two.  By day two, the patient had progressed with therapy and meeting their goals.  Incision was healing well.  Patient was seen in rounds and was ready to go home.   Diet: Regular diet Activity:WBAT Follow-up:in 10-14 days Disposition - Home Discharged Condition: good    Allergies as of 03/16/2018   No Known Allergies     Medication List    STOP taking these medications   ibuprofen 200 MG tablet Commonly known as:  ADVIL,MOTRIN     TAKE these medications   aspirin 325 MG EC tablet Take 1 tablet (325 mg total) by  mouth 2 (two) times daily after a meal.   clomiPHENE 50 MG tablet Commonly known as:  CLOMID 1/4 tab daily What changed:    how much to take  how to take this  when to take this  additional instructions   docusate sodium 100 MG capsule Commonly known as:  COLACE Take 1 capsule (100 mg total) by mouth 2 (two) times daily.   methocarbamol 500 MG tablet Commonly known as:  ROBAXIN Take 1 tablet (500 mg total) by mouth every 6 (six) hours as needed for muscle spasms.   multivitamin capsule Take 1 capsule by mouth daily.   oxyCODONE 5 MG immediate release tablet Commonly known as:  ROXICODONE Take 1 tablet (5 mg total) by mouth every 8 (eight) hours as needed.   polyethylene glycol packet Commonly known as:  MIRALAX Take 17 g by mouth daily.   VITAMIN C PO Take 1 tablet by mouth daily.      Follow-up Information    Susa Day, MD Follow up in 2 week(s).   Specialty:  Orthopedic Surgery Contact information: [address] [place] [address] [phone]           Signed: Lacie Draft PA-C Orthopaedic Surgery 03/16/2018, 9:43 AM

## 2018-03-16 NOTE — Plan of Care (Signed)
Reviewed plan of care, specifically safety and pain control measures. Pt attentive and verbalized understanding of all education.

## 2018-03-16 NOTE — Progress Notes (Signed)
Subjective: 1 Day Post-Op Procedure(s) (LRB): LEFT TOTAL KNEE ARTHROPLASTY with one screw removed, bone graft to tibial (Left) HARDWARE REMOVAL (Left) Patient reports pain as 3 on 0-10 scale.    Objective: Vital signs in last 24 hours: Temp:  [97.9 F (36.6 C)-99 F (37.2 C)] 98.4 F (36.9 C) (04/25 0503) Pulse Rate:  [94-109] 99 (04/25 0503) Resp:  [13-19] 16 (04/25 0503) BP: (107-185)/(70-97) 107/70 (04/25 0503) SpO2:  [94 %-100 %] 98 % (04/25 0503)  Intake/Output from previous day: 04/24 0701 - 04/25 0700 In: 4067.7 [P.O.:1650; I.V.:1852.7; IV Piggyback:565] Out: 3500 [Urine:3350; Blood:150] Intake/Output this shift: No intake/output data recorded.  Recent Labs    03/16/18 0529  HGB 10.3*   Recent Labs    03/16/18 0529  WBC 8.9  RBC 4.05*  HCT 32.6*  PLT 162   Recent Labs    03/16/18 0529  NA 136  K 3.7  CL 106  CO2 24  BUN 13  CREATININE 0.79  GLUCOSE 130*  CALCIUM 7.6*   No results for input(s): LABPT, INR in the last 72 hours.  Neurologically intact Sensation intact distally Intact pulses distally Incision: dressing C/D/I and no drainage  Skin intact. No DVT  Anticipated LOS equal to or greater than 2 midnights due to - Age 50 and older with one or more of the following:  - Obesity  - Expected need for hospital services (PT, OT, Nursing) required for safe  discharge  - Anticipated need for postoperative skilled nursing care or inpatient rehab  - Active co-morbidities: Previous surgery. Hardware removal. Longer OR time OR   - Unanticipated findings during/Post Surgery: Lab abnormalities  - Patient is a high risk of re-admission due to: None   Assessment/Plan: 1 Day Post-Op Procedure(s) (LRB): LEFT TOTAL KNEE ARTHROPLASTY with one screw removed, bone graft to tibial (Left) HARDWARE REMOVAL (Left) Advance diet Up with therapy D/C IV fluids Plan for discharge tomorrow  Elevate. Hematochromatosis - No Fe Xray Good    Xavier Blevins  C 03/16/2018, 7:27 AM

## 2018-03-17 LAB — CBC
HCT: 33.8 % — ABNORMAL LOW (ref 39.0–52.0)
Hemoglobin: 10.8 g/dL — ABNORMAL LOW (ref 13.0–17.0)
MCH: 26 pg (ref 26.0–34.0)
MCHC: 32 g/dL (ref 30.0–36.0)
MCV: 81.4 fL (ref 78.0–100.0)
PLATELETS: 164 10*3/uL (ref 150–400)
RBC: 4.15 MIL/uL — AB (ref 4.22–5.81)
RDW: 17.2 % — ABNORMAL HIGH (ref 11.5–15.5)
WBC: 12 10*3/uL — ABNORMAL HIGH (ref 4.0–10.5)

## 2018-03-17 NOTE — Progress Notes (Signed)
Discharge planning, spoke with patient and spouse at bedside. Have chosen [facility] for HH PT, evaluate and treat. Contacted [facility] for referral. Needs RW and 3n1, contacted [facility] to deliver to room. [phone] 

## 2018-03-17 NOTE — Progress Notes (Signed)
Discharge instructions reviewed with patient and family. Both verbalized understanding of all instructions. Patient will d/c home with all belongings, instructions, prescriptions and equipment once delivered. Will cont to monitor in meantime.

## 2018-03-17 NOTE — Progress Notes (Addendum)
Subjective: 2 Days Post-Op Procedure(s) (LRB): LEFT TOTAL KNEE ARTHROPLASTY with one screw removed, bone graft to tibial (Left) HARDWARE REMOVAL (Left) Patient reports pain as moderate.  Pain uncontrolled after 2nd PT yesterday doing better this AM. Yesterday pain was more in thigh and calf. Today more in knee. + flatus no BM yet denies any distention. No voiding issues. No N/V. Feels ready to go home today.  Objective: Vital signs in last 24 hours: Temp:  [97.7 F (36.5 C)-99 F (37.2 C)] 98.7 F (37.1 C) (04/26 0544) Pulse Rate:  [98-103] 103 (04/26 0544) Resp:  [18] 18 (04/25 2246) BP: (139-168)/(78-103) 139/78 (04/26 0544) SpO2:  [98 %-99 %] 98 % (04/26 0544)  Intake/Output from previous day: 04/25 0701 - 04/26 0700 In: 2067.5 [P.O.:1830; I.V.:237.5] Out: 3875 [Urine:3875] Intake/Output this shift: No intake/output data recorded.  Recent Labs    03/16/18 0529 03/17/18 0526  HGB 10.3* 10.8*   Recent Labs    03/16/18 0529 03/17/18 0526  WBC 8.9 12.0*  RBC 4.05* 4.15*  HCT 32.6* 33.8*  PLT 162 164   Recent Labs    03/16/18 0529  NA 136  K 3.7  CL 106  CO2 24  BUN 13  CREATININE 0.79  GLUCOSE 130*  CALCIUM 7.6*   No results for input(s): LABPT, INR in the last 72 hours.  Neurologically intact ABD soft Neurovascular intact Sensation intact distally Intact pulses distally Dorsiflexion/Plantar flexion intact Incision: scant drainage No cellulitis present Compartment soft no calf pain or sign of DVT. Moderate swelling. Minimal ecchymosis, medial knee. Scant dried bloody drainage on dressing.  Anticipated LOS equal to or greater than 2 midnights due to - Unanticipated findings during/Post Surgery: hardware removal with increased tourniquet time and bleeding     Assessment/Plan: 2 Days Post-Op Procedure(s) (LRB): LEFT TOTAL KNEE ARTHROPLASTY with one screw removed, bone graft to tibial (Left) HARDWARE REMOVAL (Left) Advance diet Up with therapy D/C  IV fluids  Plan D/C home today after PT Discussed ice and elevation for swelling Will discuss with Dr. Shelle IronBeane ASA for DVT ppx  Andrez GrimeBISSELL, JACLYN M. 03/17/2018, 9:27 AM

## 2018-03-17 NOTE — Progress Notes (Signed)
Physical Therapy Treatment Patient Details Name: Xavier Blevins MRN: [id number] DOB: 02-08-1966 Today's Date: 03/17/2018    History of Present Illness Pt is a 52 year old male s/p LEFT TOTAL KNEE ARTHROPLASTY with one screw removed, bone graft to tibial with hx of L ACL repair and revision in 1987-88    PT Comments    Pt ambulated in hallway and practiced safe stair technique.  Pt also performed LE exercises and provided with HEP handout.  Pt reports understanding and had no further questions.  Pt to d/c home today.  Follow Up Recommendations  Home health PT;Follow surgeon's recommendation for DC plan and follow-up therapies     Equipment Recommendations  Rolling walker with 5" wheels    Recommendations for Other Services       Precautions / Restrictions Precautions Precautions: Knee Restrictions Other Position/Activity Restrictions: WBAT    Mobility  Bed Mobility               General bed mobility comments: pt getting shorts on and upon entering room standing near bed with spouse   Transfers Overall transfer level: Needs assistance Equipment used: Rolling walker (2 wheeled) Transfers: Sit to/from Stand Sit to Stand: Supervision            Ambulation/Gait Ambulation/Gait assistance: Supervision Ambulation Distance (Feet): 160 Feet Assistive device: Rolling walker (2 wheeled) Gait Pattern/deviations: Decreased stance time - left;Antalgic;Step-through pattern     General Gait Details: verbal cues for allowing knee flexion with swing phase, step length   Stairs Stairs: Yes Stairs assistance: Supervision Stair Management: Two rails;Step to pattern;Forwards Number of Stairs: 3 General stair comments: verbal cues for sequence and safety, pt performed twice, reports understanding   Wheelchair Mobility    Modified Rankin (Stroke Patients Only)       Balance                                            Cognition Arousal/Alertness:  Awake/alert Behavior During Therapy: WFL for tasks assessed/performed Overall Cognitive Status: Within Functional Limits for tasks assessed                                        Exercises Total Joint Exercises Ankle Circles/Pumps: AROM;Both;10 reps Quad Sets: AROM;10 reps;Both Short Arc Quad: 10 reps;AAROM;Left Heel Slides: AAROM;10 reps;Seated;Left Hip ABduction/ADduction: AROM;Left Straight Leg Raises: AAROM;10 reps;Left Goniometric ROM: approximately 75* AAROM L knee flexion sitting    General Comments        Pertinent Vitals/Pain Pain Assessment: 0-10 Pain Score: 5  Pain Location: L knee with exercises Pain Descriptors / Indicators: Sore;Aching Pain Intervention(s): Repositioned;Limited activity within patient's tolerance;Monitored during session;Premedicated before session;Ice applied    Home Living                      Prior Function            PT Goals (current goals can now be found in the care plan section) Progress towards PT goals: Progressing toward goals    Frequency    7X/week      PT Plan Current plan remains appropriate    Co-evaluation              AM-PAC PT "6 Clicks" Daily Activity  Outcome Measure  Difficulty  turning over in bed (including adjusting bedclothes, sheets and blankets)?: A Little Difficulty moving from lying on back to sitting on the side of the bed? : A Little Difficulty sitting down on and standing up from a chair with arms (e.g., wheelchair, bedside commode, etc,.)?: A Little Help needed moving to and from a bed to chair (including a wheelchair)?: A Little Help needed walking in hospital room?: A Little Help needed climbing 3-5 steps with a railing? : A Little 6 Click Score: 18    End of Session   Activity Tolerance: Patient tolerated treatment well Patient left: in chair;with call bell/phone within reach;with family/visitor present Nurse Communication: Mobility status PT Visit  Diagnosis: Other abnormalities of gait and mobility (R26.89)     Time: [id number] PT Time Calculation (min) (ACUTE ONLY): 27 min  Charges:  $Gait Training: 8-22 mins $Therapeutic Exercise: 8-22 mins                    G Codes:       Zenovia JarredKati Gabriellah Rabel, PT, DPT 03/17/2018 Pager: [phone]  Maida SaleLEMYRE,KATHrine E 03/17/2018, 1:16 PM

## 2018-03-17 NOTE — Progress Notes (Signed)
Patient discharged to home w/ all belongings. Equipment to be delivered to home. Walker already with patient

## 2018-03-19 DIAGNOSIS — Z96652 Presence of left artificial knee joint: Secondary | ICD-10-CM | POA: Diagnosis not present

## 2018-03-19 DIAGNOSIS — Z7982 Long term (current) use of aspirin: Secondary | ICD-10-CM | POA: Diagnosis not present

## 2018-03-19 DIAGNOSIS — Z471 Aftercare following joint replacement surgery: Secondary | ICD-10-CM | POA: Diagnosis not present

## 2018-03-22 DIAGNOSIS — Z7982 Long term (current) use of aspirin: Secondary | ICD-10-CM | POA: Diagnosis not present

## 2018-03-22 DIAGNOSIS — Z471 Aftercare following joint replacement surgery: Secondary | ICD-10-CM | POA: Diagnosis not present

## 2018-03-22 DIAGNOSIS — Z96652 Presence of left artificial knee joint: Secondary | ICD-10-CM | POA: Diagnosis not present

## 2018-03-24 DIAGNOSIS — Z96652 Presence of left artificial knee joint: Secondary | ICD-10-CM | POA: Diagnosis not present

## 2018-03-24 DIAGNOSIS — Z471 Aftercare following joint replacement surgery: Secondary | ICD-10-CM | POA: Diagnosis not present

## 2018-03-24 DIAGNOSIS — Z7982 Long term (current) use of aspirin: Secondary | ICD-10-CM | POA: Diagnosis not present

## 2018-03-27 DIAGNOSIS — Z471 Aftercare following joint replacement surgery: Secondary | ICD-10-CM | POA: Diagnosis not present

## 2018-03-27 DIAGNOSIS — Z96652 Presence of left artificial knee joint: Secondary | ICD-10-CM | POA: Diagnosis not present

## 2018-03-27 DIAGNOSIS — Z7982 Long term (current) use of aspirin: Secondary | ICD-10-CM | POA: Diagnosis not present

## 2018-03-28 DIAGNOSIS — Z96652 Presence of left artificial knee joint: Secondary | ICD-10-CM | POA: Diagnosis not present

## 2018-03-28 DIAGNOSIS — Z7982 Long term (current) use of aspirin: Secondary | ICD-10-CM | POA: Diagnosis not present

## 2018-03-28 DIAGNOSIS — Z471 Aftercare following joint replacement surgery: Secondary | ICD-10-CM | POA: Diagnosis not present

## 2018-03-29 DIAGNOSIS — M1712 Unilateral primary osteoarthritis, left knee: Secondary | ICD-10-CM | POA: Diagnosis not present

## 2018-03-29 DIAGNOSIS — Z4889 Encounter for other specified surgical aftercare: Secondary | ICD-10-CM | POA: Diagnosis not present

## 2018-03-29 DIAGNOSIS — Z96652 Presence of left artificial knee joint: Secondary | ICD-10-CM | POA: Diagnosis not present

## 2018-03-31 DIAGNOSIS — M25562 Pain in left knee: Secondary | ICD-10-CM | POA: Diagnosis not present

## 2018-04-03 DIAGNOSIS — M25562 Pain in left knee: Secondary | ICD-10-CM | POA: Diagnosis not present

## 2018-04-05 DIAGNOSIS — M25562 Pain in left knee: Secondary | ICD-10-CM | POA: Diagnosis not present

## 2018-04-07 DIAGNOSIS — M25562 Pain in left knee: Secondary | ICD-10-CM | POA: Diagnosis not present

## 2018-04-10 DIAGNOSIS — M25562 Pain in left knee: Secondary | ICD-10-CM | POA: Diagnosis not present

## 2018-04-12 DIAGNOSIS — M25562 Pain in left knee: Secondary | ICD-10-CM | POA: Diagnosis not present

## 2018-04-14 DIAGNOSIS — M25562 Pain in left knee: Secondary | ICD-10-CM | POA: Diagnosis not present

## 2018-04-18 DIAGNOSIS — M25562 Pain in left knee: Secondary | ICD-10-CM | POA: Diagnosis not present

## 2018-04-20 DIAGNOSIS — M25562 Pain in left knee: Secondary | ICD-10-CM | POA: Diagnosis not present

## 2018-04-24 ENCOUNTER — Telehealth: Payer: Self-pay | Admitting: Endocrinology

## 2018-04-24 DIAGNOSIS — M25562 Pain in left knee: Secondary | ICD-10-CM | POA: Diagnosis not present

## 2018-04-24 NOTE — Telephone Encounter (Signed)
clomiPHENE (CLOMID) 50 MG tablet  Patient stated he needs prescription sent over to the pharmacy listed below.    [facility] [id number] - [place], [place] - [address]

## 2018-04-25 ENCOUNTER — Other Ambulatory Visit: Payer: Self-pay

## 2018-04-25 NOTE — Telephone Encounter (Signed)
Ov needed for any further refill

## 2018-04-25 NOTE — Telephone Encounter (Signed)
LVM that patient needed appointment in order to get any further refills.

## 2018-04-26 DIAGNOSIS — Z4789 Encounter for other orthopedic aftercare: Secondary | ICD-10-CM | POA: Diagnosis not present

## 2018-04-27 ENCOUNTER — Ambulatory Visit (INDEPENDENT_AMBULATORY_CARE_PROVIDER_SITE_OTHER): Payer: 59 | Admitting: Endocrinology

## 2018-04-27 ENCOUNTER — Encounter: Payer: Self-pay | Admitting: Endocrinology

## 2018-04-27 ENCOUNTER — Other Ambulatory Visit: Payer: Self-pay

## 2018-04-27 VITALS — BP 152/104 | HR 89 | Wt 228.0 lb

## 2018-04-27 DIAGNOSIS — E291 Testicular hypofunction: Secondary | ICD-10-CM | POA: Diagnosis not present

## 2018-04-27 DIAGNOSIS — E0590 Thyrotoxicosis, unspecified without thyrotoxic crisis or storm: Secondary | ICD-10-CM | POA: Diagnosis not present

## 2018-04-27 LAB — TSH: TSH: 0.5 u[IU]/mL (ref 0.35–4.50)

## 2018-04-27 LAB — T4, FREE: Free T4: 0.95 ng/dL (ref 0.60–1.60)

## 2018-04-27 MED ORDER — CLOMIPHENE CITRATE 50 MG PO TABS
ORAL_TABLET | ORAL | 5 refills | Status: DC
Start: 2018-04-27 — End: 2018-04-30

## 2018-04-27 NOTE — Progress Notes (Signed)
Subjective:    Patient ID: Xavier Blevins, male    DOB: 1966-03-01, 52 y.o.   MRN: [id number]  HPI Pt returns for f/u of idiopathic central hypogonadism (dx'ed 2017; he has 2 biological children; prolactin was normal; he was rx'ed clomid).  He takes clomid as rx'ed.  pt states he feels well in general.  He also has mild hyperthyroidism (dx'ed 2017; he has never been on rx for this; he has never had thyroid imaging). He recently had TKR.   Past Medical History:  Diagnosis Date  . Hyperthyroidism   . Hypogonadism male     Past Surgical History:  Procedure Laterality Date  . ANTERIOR CRUCIATE LIGAMENT (ACL) REVISION Left 1988   Replacement  . CHOLECYSTECTOMY  1999  . HARDWARE REMOVAL Left 03/15/2018   Procedure: HARDWARE REMOVAL;  Surgeon: Jene EveryBeane, Jeffrey, MD;  Location: [facility];  Service: Orthopedics;  Laterality: Left;  with block  . HEEL SPUR EXCISION Left    Bone spur L Great Toe  . KNEE ARTHROSCOPY WITH ANTERIOR CRUCIATE LIGAMENT (ACL) REPAIR Left 1987   Partial Tear  . MENISCUS REPAIR Left 1988  . TOTAL KNEE ARTHROPLASTY Left 03/15/2018   Procedure: LEFT TOTAL KNEE ARTHROPLASTY with one screw removed, bone graft to tibial;  Surgeon: Jene EveryBeane, Jeffrey, MD;  Location: [facility];  Service: Orthopedics;  Laterality: Left;  with block. One screw to patient    Social History   Socioeconomic History  . Marital status: Married    Spouse name: Not on file  . Number of children: Not on file  . Years of education: Not on file  . Highest education level: Not on file  Occupational History  . Not on file  Social Needs  . Financial resource strain: Not on file  . Food insecurity:    Worry: Not on file    Inability: Not on file  . Transportation needs:    Medical: Not on file    Non-medical: Not on file  Tobacco Use  . Smoking status: Never Smoker  . Smokeless tobacco: Never Used  Substance and Sexual Activity  . Alcohol use: Yes    Comment: Occas  . Drug use: Never  . Sexual  activity: Yes  Lifestyle  . Physical activity:    Days per week: Not on file    Minutes per session: Not on file  . Stress: Not on file  Relationships  . Social connections:    Talks on phone: Not on file    Gets together: Not on file    Attends religious service: Not on file    Active member of club or organization: Not on file    Attends meetings of clubs or organizations: Not on file    Relationship status: Not on file  . Intimate partner violence:    Fear of current or ex partner: Not on file    Emotionally abused: Not on file    Physically abused: Not on file    Forced sexual activity: Not on file  Other Topics Concern  . Not on file  Social History Narrative  . Not on file    Current Outpatient Medications on File Prior to Visit  Medication Sig Dispense Refill  . Ascorbic Acid (VITAMIN C PO) Take 1 tablet by mouth daily.    Marland Kitchen. aspirin EC 325 MG EC tablet Take 1 tablet (325 mg total) by mouth 2 (two) times daily after a meal. (Patient taking differently: Take 115 mg by mouth daily. ) 30 tablet  0  . docusate sodium (COLACE) 100 MG capsule Take 1 capsule (100 mg total) by mouth 2 (two) times daily. (Patient not taking: Reported on 04/27/2018) 40 capsule 2  . methocarbamol (ROBAXIN) 500 MG tablet Take 1 tablet (500 mg total) by mouth every 6 (six) hours as needed for muscle spasms. 40 tablet 1  . Multiple Vitamin (MULTIVITAMIN) capsule Take 1 capsule by mouth daily.     No current facility-administered medications on file prior to visit.     No Known Allergies  Family History  Problem Relation Age of Onset  . Other Father        hypogonadism    BP (!) 152/104   Pulse 89   Wt 228 lb (103.4 kg)   SpO2 98%   BMI 35.71 kg/m    Review of Systems Denies decreased urinary stream    Objective:   Physical Exam VITAL SIGNS:  See vs page GENERAL: no distress NECK: There is no palpable thyroid enlargement.  No thyroid nodule is palpable.  No palpable lymphadenopathy at  the anterior neck.      Assessment & Plan:  Hypogonadism: recheck today.  Hyperthyroidism: recheck today.  Patient Instructions  blood tests are requested for you today.  We'll let you know about the results.  Testosterone treatment has risks, including increased fertility, hair loss, prostate cancer, benign prostate enlargement, blood clots, liver problems, lower hdl ("good cholesterol"), polycythemia (opposite of anemia), sleep apnea, and behavior changes.  If the thyroid is off again, you should take a daily pill to slow it down, and please come back for a follow-up appointment in 2 months.  Otherwise, please return in 1 year.

## 2018-04-27 NOTE — Patient Instructions (Signed)
blood tests are requested for you today.  We'll let you know about the results.  Testosterone treatment has risks, including increased fertility, hair loss, prostate cancer, benign prostate enlargement, blood clots, liver problems, lower hdl ("good cholesterol"), polycythemia (opposite of anemia), sleep apnea, and behavior changes.  If the thyroid is off again, you should take a daily pill to slow it down, and please come back for a follow-up appointment in 2 months.  Otherwise, please return in 1 year.

## 2018-04-28 DIAGNOSIS — M25562 Pain in left knee: Secondary | ICD-10-CM | POA: Diagnosis not present

## 2018-04-29 LAB — TESTOSTERONE,FREE AND TOTAL
TESTOSTERONE: 438 ng/dL (ref 264–916)
Testosterone, Free: 10.5 pg/mL (ref 7.2–24.0)

## 2018-04-30 MED ORDER — CLOMIPHENE CITRATE 50 MG PO TABS: ORAL_TABLET | ORAL | 8 refills | Status: DC

## 2018-05-01 DIAGNOSIS — M25562 Pain in left knee: Secondary | ICD-10-CM | POA: Diagnosis not present

## 2018-05-03 DIAGNOSIS — M25562 Pain in left knee: Secondary | ICD-10-CM | POA: Diagnosis not present

## 2018-05-09 DIAGNOSIS — M25562 Pain in left knee: Secondary | ICD-10-CM | POA: Diagnosis not present

## 2018-05-12 DIAGNOSIS — M25562 Pain in left knee: Secondary | ICD-10-CM | POA: Diagnosis not present

## 2018-05-15 DIAGNOSIS — M25562 Pain in left knee: Secondary | ICD-10-CM | POA: Diagnosis not present

## 2018-05-18 DIAGNOSIS — Z4889 Encounter for other specified surgical aftercare: Secondary | ICD-10-CM | POA: Diagnosis not present

## 2018-05-26 DIAGNOSIS — M25562 Pain in left knee: Secondary | ICD-10-CM | POA: Diagnosis not present

## 2018-06-12 DIAGNOSIS — M25562 Pain in left knee: Secondary | ICD-10-CM | POA: Diagnosis not present

## 2018-06-19 DIAGNOSIS — M25562 Pain in left knee: Secondary | ICD-10-CM | POA: Diagnosis not present

## 2018-09-04 DIAGNOSIS — Z23 Encounter for immunization: Secondary | ICD-10-CM | POA: Diagnosis not present

## 2018-09-11 DIAGNOSIS — Z13220 Encounter for screening for lipoid disorders: Secondary | ICD-10-CM | POA: Diagnosis not present

## 2018-09-11 DIAGNOSIS — Z1211 Encounter for screening for malignant neoplasm of colon: Secondary | ICD-10-CM | POA: Diagnosis not present

## 2018-09-11 DIAGNOSIS — Z23 Encounter for immunization: Secondary | ICD-10-CM | POA: Diagnosis not present

## 2018-09-11 DIAGNOSIS — Z125 Encounter for screening for malignant neoplasm of prostate: Secondary | ICD-10-CM | POA: Diagnosis not present

## 2018-09-11 DIAGNOSIS — Z0000 Encounter for general adult medical examination without abnormal findings: Secondary | ICD-10-CM | POA: Diagnosis not present

## 2018-09-22 ENCOUNTER — Ambulatory Visit: Payer: 59 | Admitting: Endocrinology

## 2018-09-25 ENCOUNTER — Ambulatory Visit (INDEPENDENT_AMBULATORY_CARE_PROVIDER_SITE_OTHER): Payer: 59 | Admitting: Endocrinology

## 2018-09-25 ENCOUNTER — Encounter: Payer: Self-pay | Admitting: Endocrinology

## 2018-09-25 VITALS — BP 140/82 | HR 114 | Ht 67.0 in | Wt 243.4 lb

## 2018-09-25 DIAGNOSIS — E291 Testicular hypofunction: Secondary | ICD-10-CM

## 2018-09-25 DIAGNOSIS — E0590 Thyrotoxicosis, unspecified without thyrotoxic crisis or storm: Secondary | ICD-10-CM

## 2018-09-25 LAB — TSH: TSH: 0.22 u[IU]/mL — ABNORMAL LOW (ref 0.35–4.50)

## 2018-09-25 NOTE — Patient Instructions (Addendum)
blood tests are requested for you today.  We'll let you know about the results.  Testosterone treatment has risks, including increased fertility, hair loss, prostate cancer, benign prostate enlargement, blood clots, liver problems, lower hdl ("good cholesterol"), polycythemia (opposite of anemia), sleep apnea, and behavior changes.  Please come back for a follow-up appointment in 1 year.   

## 2018-09-25 NOTE — Progress Notes (Signed)
Subjective:    Patient ID: Xavier Blevins, male    DOB: 04-25-1966, 52 y.o.   MRN: [id number]  HPI Pt returns for f/u of idiopathic central hypogonadism (dx'ed 2017; he has 2 biological children; prolactin was normal; he was rx'ed clomid).  He takes clomid as rx'ed.  pt states he feels well in general.   He also has mild hyperthyroidism (dx'ed 2017; he has never been on rx for this; he has never had thyroid imaging). He recently had TKR.   Past Medical History:  Diagnosis Date  . Hyperthyroidism   . Hypogonadism male     Past Surgical History:  Procedure Laterality Date  . ANTERIOR CRUCIATE LIGAMENT (ACL) REVISION Left 1988   Replacement  . CHOLECYSTECTOMY  1999  . HARDWARE REMOVAL Left 03/15/2018   Procedure: HARDWARE REMOVAL;  Surgeon: Jene Every, MD;  Location: [facility];  Service: Orthopedics;  Laterality: Left;  with block  . HEEL SPUR EXCISION Left    Bone spur L Great Toe  . KNEE ARTHROSCOPY WITH ANTERIOR CRUCIATE LIGAMENT (ACL) REPAIR Left 1987   Partial Tear  . MENISCUS REPAIR Left 1988  . TOTAL KNEE ARTHROPLASTY Left 03/15/2018   Procedure: LEFT TOTAL KNEE ARTHROPLASTY with one screw removed, bone graft to tibial;  Surgeon: Jene Every, MD;  Location: [facility];  Service: Orthopedics;  Laterality: Left;  with block. One screw to patient    Social History   Socioeconomic History  . Marital status: Married    Spouse name: Not on file  . Number of children: Not on file  . Years of education: Not on file  . Highest education level: Not on file  Occupational History  . Not on file  Social Needs  . Financial resource strain: Not on file  . Food insecurity:    Worry: Not on file    Inability: Not on file  . Transportation needs:    Medical: Not on file    Non-medical: Not on file  Tobacco Use  . Smoking status: Never Smoker  . Smokeless tobacco: Never Used  Substance and Sexual Activity  . Alcohol use: Yes    Comment: Occas  . Drug use: Never  . Sexual  activity: Yes  Lifestyle  . Physical activity:    Days per week: Not on file    Minutes per session: Not on file  . Stress: Not on file  Relationships  . Social connections:    Talks on phone: Not on file    Gets together: Not on file    Attends religious service: Not on file    Active member of club or organization: Not on file    Attends meetings of clubs or organizations: Not on file    Relationship status: Not on file  . Intimate partner violence:    Fear of current or ex partner: Not on file    Emotionally abused: Not on file    Physically abused: Not on file    Forced sexual activity: Not on file  Other Topics Concern  . Not on file  Social History Narrative  . Not on file    Current Outpatient Medications on File Prior to Visit  Medication Sig Dispense Refill  . Ascorbic Acid (VITAMIN C PO) Take 1 tablet by mouth daily.    Marland Kitchen aspirin EC 325 MG EC tablet Take 1 tablet (325 mg total) by mouth 2 (two) times daily after a meal. (Patient taking differently: Take 115 mg by mouth daily. ) 30  tablet 0  . clomiPHENE (CLOMID) 50 MG tablet 1/4 tab daily 10 tablet 8  . Multiple Vitamin (MULTIVITAMIN) capsule Take 1 capsule by mouth daily.    Marland Kitchen docusate sodium (COLACE) 100 MG capsule Take 1 capsule (100 mg total) by mouth 2 (two) times daily. (Patient not taking: Reported on 09/25/2018) 40 capsule 2   No current facility-administered medications on file prior to visit.     No Known Allergies  Family History  Problem Relation Age of Onset  . Other Father        hypogonadism    BP 140/82 (BP Location: Left Arm, Patient Position: Sitting, Cuff Size: Normal)   Pulse (!) 114   Ht 5\' 7"  (1.702 m)   Wt 243 lb 6.4 oz (110.4 kg)   SpO2 94%   BMI 38.12 kg/m    Review of Systems No weight change    Objective:   Physical Exam VITAL SIGNS:  See vs page GENERAL: no distress NECK: There is no palpable thyroid enlargement.  No thyroid nodule is palpable.  No palpable  lymphadenopathy at the anterior neck.   Lab Results  Component Value Date   TSH 0.22 (L) 09/25/2018      Assessment & Plan:  idiopathic central hypogonadism: recheck today Hyperthyroidism, recurrent.   Tachycardia: very unlikely thyroid-related.  Still, I advised closer thyroid f/u.  Please come back for a follow-up appointment in 3 months

## 2018-09-26 ENCOUNTER — Telehealth: Payer: Self-pay | Admitting: Endocrinology

## 2018-09-26 LAB — TESTOSTERONE,FREE AND TOTAL
Testosterone, Free: 14.2 pg/mL (ref 7.2–24.0)
Testosterone: 563 ng/dL (ref 264–916)

## 2018-09-26 NOTE — Telephone Encounter (Signed)
Returned pt call. Lab results reviewed. Verbalized acceptance and understanding. Will call back to schedule appt with lab for repeat lab draw in 2-3 months

## 2018-09-26 NOTE — Telephone Encounter (Signed)
Patient stated that he received a message from our office for lab results.

## 2018-11-10 DIAGNOSIS — Z1211 Encounter for screening for malignant neoplasm of colon: Secondary | ICD-10-CM | POA: Diagnosis not present

## 2018-12-11 ENCOUNTER — Other Ambulatory Visit (INDEPENDENT_AMBULATORY_CARE_PROVIDER_SITE_OTHER): Payer: 59

## 2018-12-11 DIAGNOSIS — E0590 Thyrotoxicosis, unspecified without thyrotoxic crisis or storm: Secondary | ICD-10-CM

## 2018-12-11 LAB — T4, FREE: Free T4: 0.85 ng/dL (ref 0.60–1.60)

## 2018-12-11 LAB — TSH: TSH: 0.35 u[IU]/mL (ref 0.35–4.50)

## 2019-01-04 ENCOUNTER — Other Ambulatory Visit: Payer: Self-pay | Admitting: Endocrinology

## 2019-06-01 ENCOUNTER — Other Ambulatory Visit: Payer: Self-pay | Admitting: Endocrinology

## 2019-07-02 IMAGING — DX DG KNEE 1-2V PORT*L*
2 series · 2 of 2 positions shown · non-contrast
Comparison: None.

CLINICAL DATA: Status post arthroplasty.

EXAM:
PORTABLE LEFT KNEE - 1-2 VIEW

[knee ap]
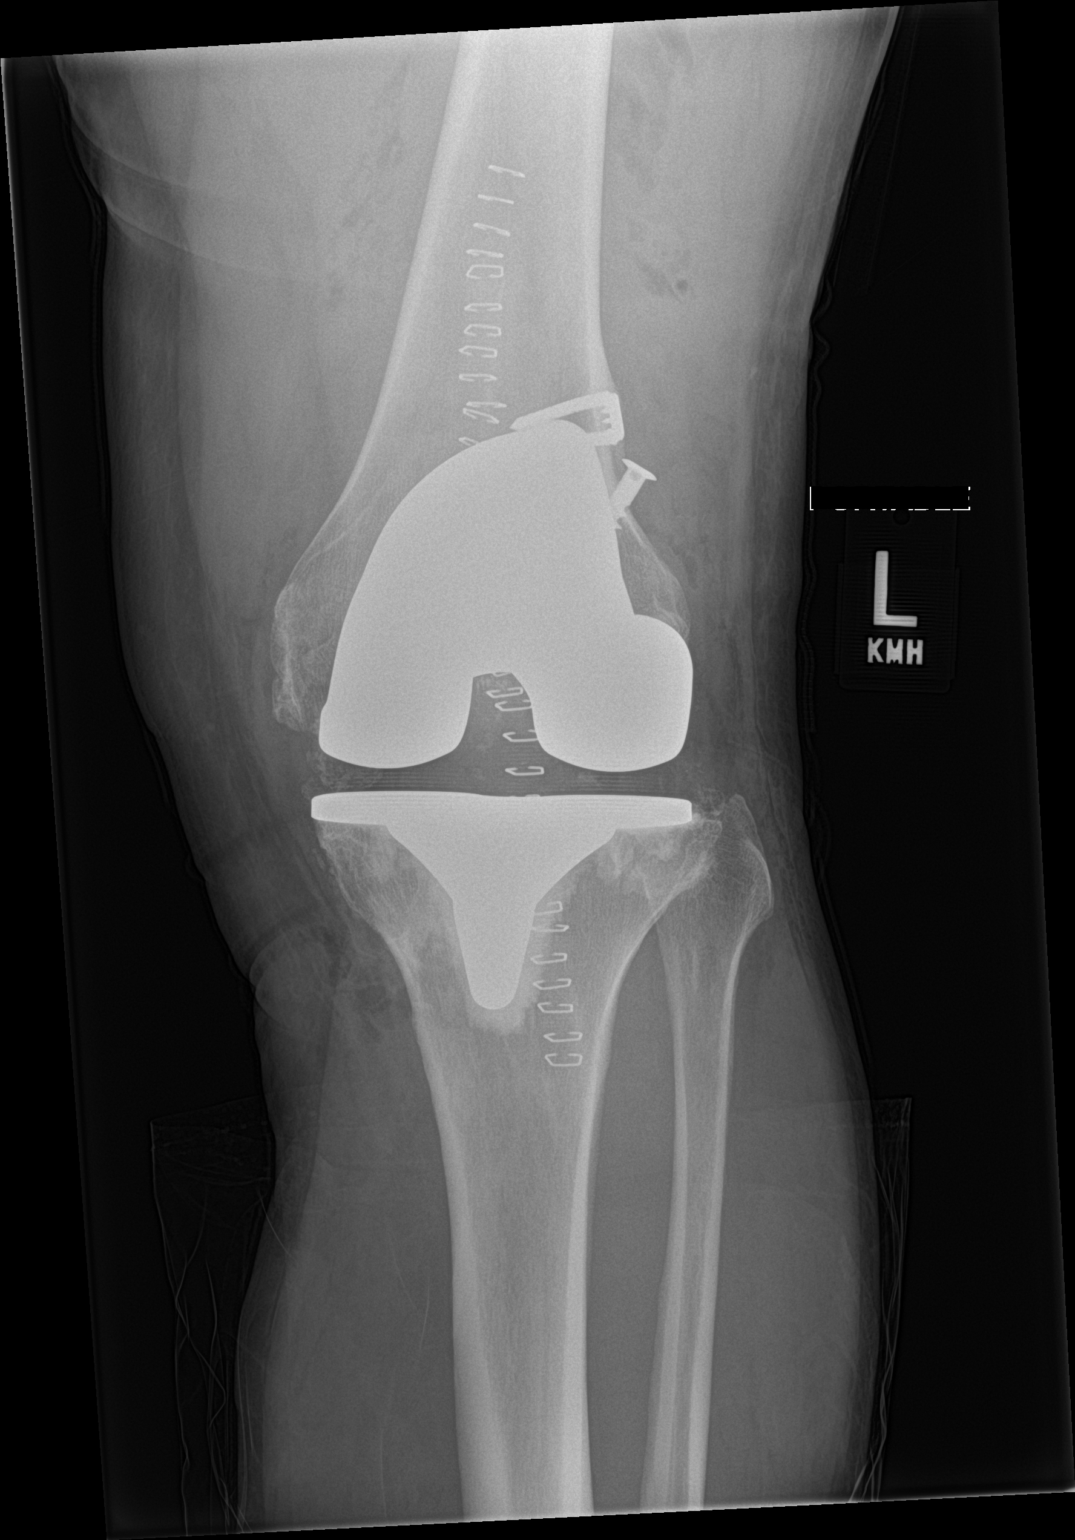

[knee lat]
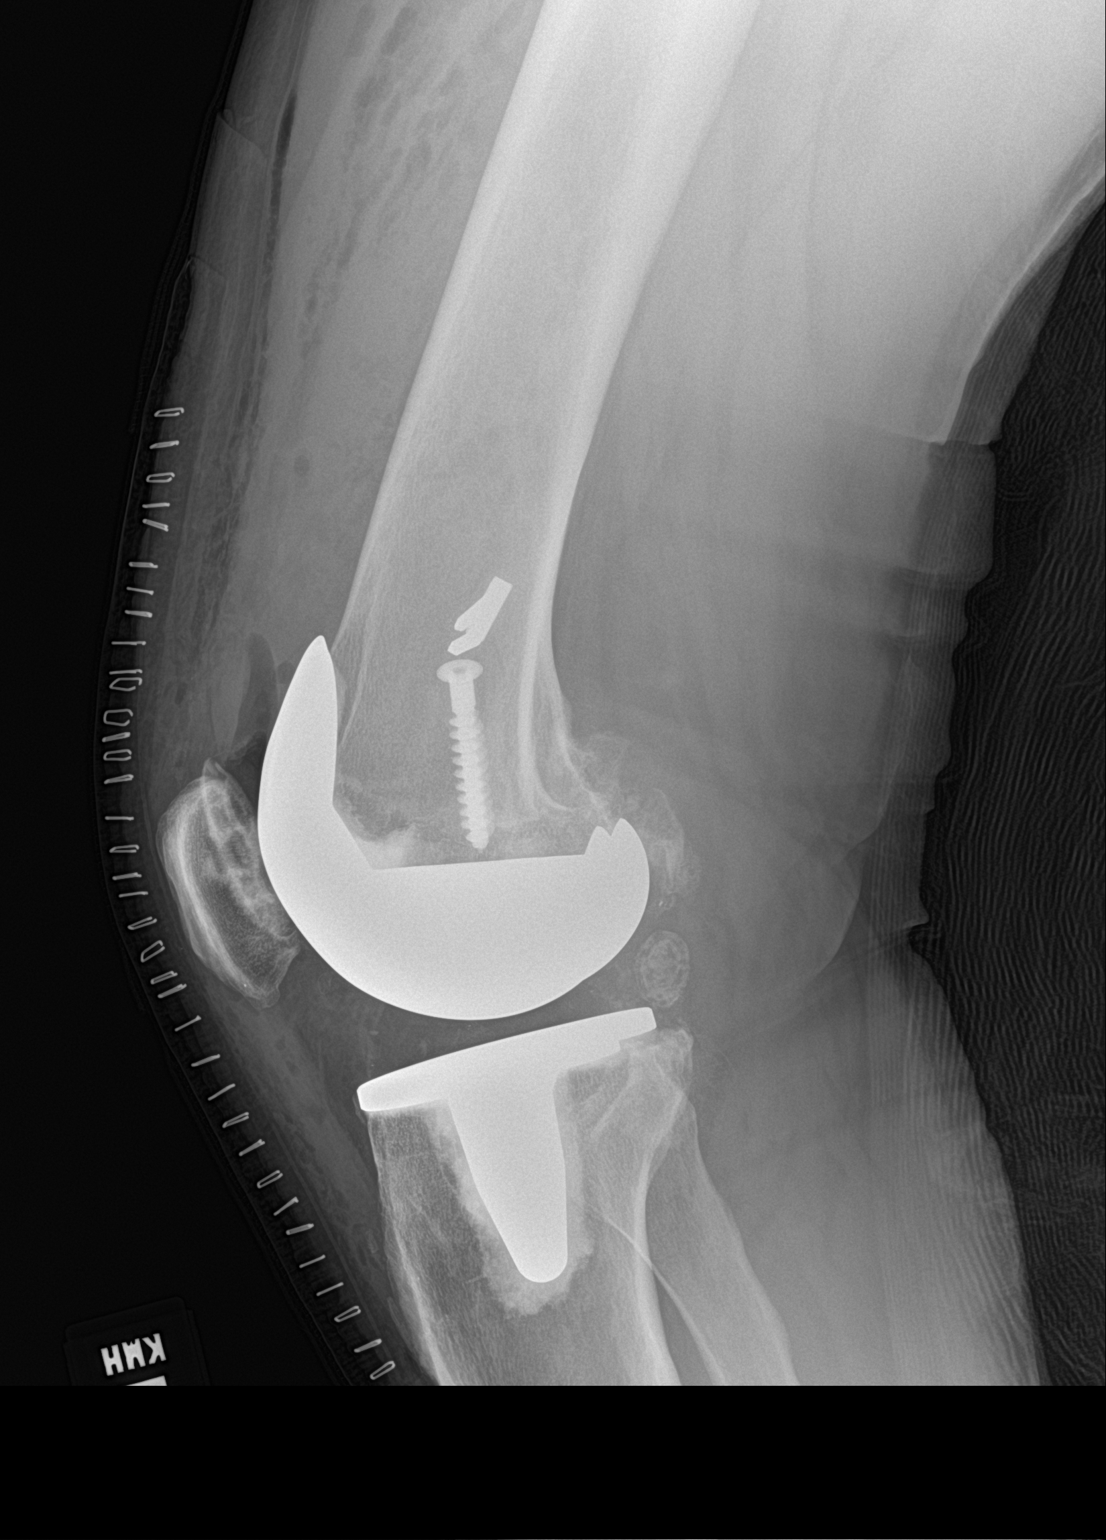

[2 of 2 positions shown; findings below may reference images not displayed]

FINDINGS: Status post LEFT total knee arthroplasty. Satisfactory position and
alignment. Distal femoral screw and staple are noted along the
lateral aspect. Air is seen in the soft tissues, postoperatively.
IMPRESSION: No adverse features.

## 2019-09-27 ENCOUNTER — Other Ambulatory Visit: Payer: Self-pay

## 2019-10-01 ENCOUNTER — Ambulatory Visit (INDEPENDENT_AMBULATORY_CARE_PROVIDER_SITE_OTHER): Payer: 59 | Admitting: Endocrinology

## 2019-10-01 ENCOUNTER — Other Ambulatory Visit (INDEPENDENT_AMBULATORY_CARE_PROVIDER_SITE_OTHER): Payer: 59

## 2019-10-01 ENCOUNTER — Encounter: Payer: Self-pay | Admitting: Endocrinology

## 2019-10-01 ENCOUNTER — Other Ambulatory Visit: Payer: Self-pay

## 2019-10-01 VITALS — BP 130/62 | HR 112 | Ht 67.0 in | Wt 245.0 lb

## 2019-10-01 DIAGNOSIS — Z125 Encounter for screening for malignant neoplasm of prostate: Secondary | ICD-10-CM

## 2019-10-01 DIAGNOSIS — E0590 Thyrotoxicosis, unspecified without thyrotoxic crisis or storm: Secondary | ICD-10-CM | POA: Diagnosis not present

## 2019-10-01 DIAGNOSIS — E291 Testicular hypofunction: Secondary | ICD-10-CM

## 2019-10-01 LAB — PSA: PSA: 0.6 ng/mL (ref 0.10–4.00)

## 2019-10-01 LAB — TSH: TSH: 0.35 u[IU]/mL (ref 0.35–4.50)

## 2019-10-01 LAB — T4, FREE: Free T4: 0.99 ng/dL (ref 0.60–1.60)

## 2019-10-01 NOTE — Progress Notes (Signed)
Subjective:    Patient ID: Xavier Blevins, male    DOB: 1966-04-28, 53 y.o.   MRN: [id number]  HPI Pt returns for f/u of idiopathic central hypogonadism (dx'ed 2017; testosterone was not low enough to merit MRI; he has 2 biological children; prolactin was normal; he was rx'ed clomid).  He takes clomid as rx'ed.  pt states he feels well in general.   He also has mild hyperthyroidism (dx'ed 2017; he has never been on rx for this; he has never had thyroid imaging).  He donates blood.  Past Medical History:  Diagnosis Date  . Hyperthyroidism   . Hypogonadism male     Past Surgical History:  Procedure Laterality Date  . ANTERIOR CRUCIATE LIGAMENT (ACL) REVISION Left 1988   Replacement  . CHOLECYSTECTOMY  1999  . HARDWARE REMOVAL Left 03/15/2018   Procedure: HARDWARE REMOVAL;  Surgeon: Jene Every, MD;  Location: [facility];  Service: Orthopedics;  Laterality: Left;  with block  . HEEL SPUR EXCISION Left    Bone spur L Great Toe  . KNEE ARTHROSCOPY WITH ANTERIOR CRUCIATE LIGAMENT (ACL) REPAIR Left 1987   Partial Tear  . MENISCUS REPAIR Left 1988  . TOTAL KNEE ARTHROPLASTY Left 03/15/2018   Procedure: LEFT TOTAL KNEE ARTHROPLASTY with one screw removed, bone graft to tibial;  Surgeon: Jene Every, MD;  Location: [facility];  Service: Orthopedics;  Laterality: Left;  with block. One screw to patient    Social History   Socioeconomic History  . Marital status: Married    Spouse name: Not on file  . Number of children: Not on file  . Years of education: Not on file  . Highest education level: Not on file  Occupational History  . Not on file  Social Needs  . Financial resource strain: Not on file  . Food insecurity    Worry: Not on file    Inability: Not on file  . Transportation needs    Medical: Not on file    Non-medical: Not on file  Tobacco Use  . Smoking status: Never Smoker  . Smokeless tobacco: Never Used  Substance and Sexual Activity  . Alcohol use: Yes    Comment:  Occas  . Drug use: Never  . Sexual activity: Yes  Lifestyle  . Physical activity    Days per week: Not on file    Minutes per session: Not on file  . Stress: Not on file  Relationships  . Social Musician on phone: Not on file    Gets together: Not on file    Attends religious service: Not on file    Active member of club or organization: Not on file    Attends meetings of clubs or organizations: Not on file    Relationship status: Not on file  . Intimate partner violence    Fear of current or ex partner: Not on file    Emotionally abused: Not on file    Physically abused: Not on file    Forced sexual activity: Not on file  Other Topics Concern  . Not on file  Social History Narrative  . Not on file    Current Outpatient Medications on File Prior to Visit  Medication Sig Dispense Refill  . Ascorbic Acid (VITAMIN C PO) Take 1 tablet by mouth daily.    Marland Kitchen aspirin EC 325 MG EC tablet Take 1 tablet (325 mg total) by mouth 2 (two) times daily after a meal. (Patient taking differently: Take  115 mg by mouth daily. ) 30 tablet 0  . clomiPHENE (CLOMID) 50 MG tablet TAKE 1/4 TABLET BY MOUTH DAILY 10 tablet 8  . Multiple Vitamin (MULTIVITAMIN) capsule Take 1 capsule by mouth daily.     No current facility-administered medications on file prior to visit.     No Known Allergies  Family History  Problem Relation Age of Onset  . Other Father        hypogonadism    BP 130/62 (BP Location: Right Arm, Patient Position: Sitting, Cuff Size: Large)   Pulse (!) 112   Ht 5\' 7"  (1.702 m)   Wt 245 lb (111.1 kg)   SpO2 95%   BMI 38.37 kg/m    Review of Systems He denies decreased urinary stream.  He denies ED sxs.    Objective:   Physical Exam VITAL SIGNS:  See vs page GENERAL: no distress NECK: There is no palpable thyroid enlargement.  No thyroid nodule is palpable.  No palpable lymphadenopathy at the anterior neck.   Lab Results  Component Value Date   TSH 0.35  10/01/2019      Assessment & Plan:  Hyperthyroidism, in remission: we'll follow Hypogonadism: well-controlled.   Patient Instructions  blood tests are requested for you today.  We'll let you know about the results.  Testosterone treatment has risks, including increased fertility, hair loss, prostate cancer, benign prostate enlargement, blood clots, liver problems, lower hdl ("good cholesterol"), polycythemia (opposite of anemia), sleep apnea, and behavior changes.  Please come back for a follow-up appointment in 1 year.

## 2019-10-01 NOTE — Patient Instructions (Signed)
blood tests are requested for you today.  We'll let you know about the results.  Testosterone treatment has risks, including increased fertility, hair loss, prostate cancer, benign prostate enlargement, blood clots, liver problems, lower hdl ("good cholesterol"), polycythemia (opposite of anemia), sleep apnea, and behavior changes.  Please come back for a follow-up appointment in 1 year.   

## 2019-10-02 ENCOUNTER — Other Ambulatory Visit: Payer: Self-pay

## 2019-10-02 DIAGNOSIS — E291 Testicular hypofunction: Secondary | ICD-10-CM

## 2019-10-03 LAB — TESTOSTERONE,FREE AND TOTAL
Testosterone, Free: 11.3 pg/mL (ref 7.2–24.0)
Testosterone: 378 ng/dL (ref 264–916)

## 2019-10-03 LAB — SPECIMEN STATUS REPORT

## 2020-03-03 DIAGNOSIS — R011 Cardiac murmur, unspecified: Secondary | ICD-10-CM | POA: Diagnosis not present

## 2020-03-03 DIAGNOSIS — R0681 Apnea, not elsewhere classified: Secondary | ICD-10-CM | POA: Diagnosis not present

## 2020-03-03 DIAGNOSIS — R030 Elevated blood-pressure reading, without diagnosis of hypertension: Secondary | ICD-10-CM | POA: Diagnosis not present

## 2020-03-17 DIAGNOSIS — I10 Essential (primary) hypertension: Secondary | ICD-10-CM | POA: Diagnosis not present
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
## 2020-03-24 ENCOUNTER — Other Ambulatory Visit: Payer: Self-pay

## 2020-03-24 ENCOUNTER — Encounter: Payer: Self-pay | Admitting: Cardiovascular Disease

## 2020-03-24 ENCOUNTER — Ambulatory Visit (INDEPENDENT_AMBULATORY_CARE_PROVIDER_SITE_OTHER): Payer: BC Managed Care – PPO | Admitting: Cardiovascular Disease

## 2020-03-24 VITALS — BP 116/76 | HR 71 | Ht 67.0 in | Wt 233.6 lb

## 2020-03-24 DIAGNOSIS — E669 Obesity, unspecified: Secondary | ICD-10-CM

## 2020-03-24 DIAGNOSIS — R011 Cardiac murmur, unspecified: Secondary | ICD-10-CM | POA: Diagnosis not present

## 2020-03-24 DIAGNOSIS — I10 Essential (primary) hypertension: Secondary | ICD-10-CM | POA: Diagnosis not present

## 2020-03-24 NOTE — Patient Instructions (Signed)
Medication Instructions:  The current medical regimen is effective;  continue present plan and medications.  *If you need a refill on your cardiac medications before your next appointment, please call your pharmacy*   Testing/Procedures: Echocardiogram - Your physician has requested that you have an echocardiogram. Echocardiography is a painless test that uses sound waves to create images of your heart. It provides your doctor with information about the size and shape of your heart and how well your heart's chambers and valves are working. This procedure takes approximately one hour. There are no restrictions for this procedure. This will be performed at our [address] location - [address], [address].    Follow-Up: At [facility], you and your health needs are our priority.  As part of our continuing mission to provide you with exceptional heart care, we have created designated Provider Care Teams.  These Care Teams include your primary Cardiologist (physician) and Advanced Practice Providers (APPs -  Physician Assistants and Nurse Practitioners) who all work together to provide you with the care you need, when you need it.  We recommend signing up for the patient portal called "MyChart".  Sign up information is provided on this After Visit Summary.  MyChart is used to connect with patients for Virtual Visits (Telemedicine).  Patients are able to view lab/test results, encounter notes, upcoming appointments, etc.  Non-urgent messages can be sent to your provider as well.   To learn more about what you can do with MyChart, go to [email or web address].    Your next appointment:   As needed  The format for your next appointment:   In Person  Provider:   Hope O'Neal, MD      

## 2020-04-07 DIAGNOSIS — R0681 Apnea, not elsewhere classified: Secondary | ICD-10-CM | POA: Diagnosis not present

## 2020-04-08 ENCOUNTER — Other Ambulatory Visit: Payer: Self-pay

## 2020-04-08 ENCOUNTER — Ambulatory Visit (HOSPITAL_COMMUNITY): Payer: BC Managed Care – PPO | Attending: Cardiovascular Disease

## 2020-04-08 DIAGNOSIS — R011 Cardiac murmur, unspecified: Secondary | ICD-10-CM | POA: Diagnosis not present

## 2020-04-09 ENCOUNTER — Telehealth: Payer: Self-pay | Admitting: Cardiovascular Disease

## 2020-04-09 NOTE — Telephone Encounter (Signed)
Pt called back returning Dr. Marylene Buerger call. Pt said it is best to call him back after 3:00 pm

## 2020-04-09 NOTE — Telephone Encounter (Signed)
Patient returned your call.

## 2020-04-09 NOTE — Telephone Encounter (Signed)
Attempted to call Xavier Blevins about recent echocardiogram. He has mitral valve prolapse with mild MR. Will need to see [place] yearly. Will need repeat echocardiogram in 3-5 years. We will also need to educate him about symptoms he should be aware of that will prompt earlier evaluation (e.g., SOB, CP, etc). No answer. Left message.   Gerri Spore T. Flora Lipps, MD [facility]  [address], [address] [place], [place] [address] [phone]  8:35 AM

## 2020-04-10 NOTE — Telephone Encounter (Signed)
I didn't get done until late last night in the hospital. Can you call him with the result note I sent?   Gerri Spore T. Flora Lipps, MD [facility]  [address], [address] [place], [place] [address] [phone]  7:55 AM

## 2020-04-11 NOTE — Telephone Encounter (Signed)
Called patient back- gave results. Patient verbalized understanding.   Sorry, I thought you wanted to speak to him- He is okay with this plan, will call with any concerns.  Thanks!

## 2020-04-17 DIAGNOSIS — I10 Essential (primary) hypertension: Secondary | ICD-10-CM | POA: Diagnosis not present

## 2020-05-08 DIAGNOSIS — G4733 Obstructive sleep apnea (adult) (pediatric): Secondary | ICD-10-CM | POA: Diagnosis not present

## 2020-05-16 DIAGNOSIS — I10 Essential (primary) hypertension: Secondary | ICD-10-CM | POA: Diagnosis not present

## 2020-05-16 DIAGNOSIS — R269 Unspecified abnormalities of gait and mobility: Secondary | ICD-10-CM | POA: Diagnosis not present

## 2020-05-17 DIAGNOSIS — R269 Unspecified abnormalities of gait and mobility: Secondary | ICD-10-CM | POA: Diagnosis not present

## 2020-05-30 ENCOUNTER — Other Ambulatory Visit: Payer: Self-pay | Admitting: Endocrinology

## 2020-06-15 DIAGNOSIS — R269 Unspecified abnormalities of gait and mobility: Secondary | ICD-10-CM | POA: Diagnosis not present

## 2020-07-16 DIAGNOSIS — R269 Unspecified abnormalities of gait and mobility: Secondary | ICD-10-CM | POA: Diagnosis not present

## 2020-08-05 DIAGNOSIS — G4733 Obstructive sleep apnea (adult) (pediatric): Secondary | ICD-10-CM | POA: Diagnosis not present

## 2020-08-16 DIAGNOSIS — R269 Unspecified abnormalities of gait and mobility: Secondary | ICD-10-CM | POA: Diagnosis not present

## 2020-09-23 ENCOUNTER — Telehealth: Payer: Self-pay | Admitting: Endocrinology

## 2020-09-23 NOTE — Telephone Encounter (Signed)
Patient called to advise that he has moved to [facility] and wants to know if Dr Everardo All has any recommendations for an Endocrinologist in that area

## 2020-09-24 NOTE — Telephone Encounter (Signed)
Attempted to reach patient to advise him to contact his insurance for an endocrinologist.  Unable to leave message.

## 2020-10-06 ENCOUNTER — Ambulatory Visit: Payer: 59 | Admitting: Endocrinology

## 2020-11-04 DIAGNOSIS — I499 Cardiac arrhythmia, unspecified: Secondary | ICD-10-CM | POA: Diagnosis not present

## 2020-11-04 DIAGNOSIS — I10 Essential (primary) hypertension: Secondary | ICD-10-CM | POA: Diagnosis not present

## 2020-11-04 DIAGNOSIS — R7989 Other specified abnormal findings of blood chemistry: Secondary | ICD-10-CM | POA: Diagnosis not present

## 2020-11-04 DIAGNOSIS — Z23 Encounter for immunization: Secondary | ICD-10-CM | POA: Diagnosis not present

## 2021-05-11 DIAGNOSIS — Z0000 Encounter for general adult medical examination without abnormal findings: Secondary | ICD-10-CM | POA: Diagnosis not present

## 2021-05-22 DIAGNOSIS — I6523 Occlusion and stenosis of bilateral carotid arteries: Secondary | ICD-10-CM | POA: Diagnosis not present

## 2021-05-22 DIAGNOSIS — R0989 Other specified symptoms and signs involving the circulatory and respiratory systems: Secondary | ICD-10-CM | POA: Diagnosis not present

## 2021-07-22 DIAGNOSIS — R011 Cardiac murmur, unspecified: Secondary | ICD-10-CM | POA: Diagnosis not present

## 2021-07-22 DIAGNOSIS — I517 Cardiomegaly: Secondary | ICD-10-CM | POA: Diagnosis not present

## 2021-07-22 DIAGNOSIS — Z719 Counseling, unspecified: Secondary | ICD-10-CM | POA: Diagnosis not present

## 2021-08-20 DIAGNOSIS — G4733 Obstructive sleep apnea (adult) (pediatric): Secondary | ICD-10-CM | POA: Diagnosis not present

## 2021-09-11 DIAGNOSIS — I517 Cardiomegaly: Secondary | ICD-10-CM | POA: Diagnosis not present

## 2021-09-11 DIAGNOSIS — R011 Cardiac murmur, unspecified: Secondary | ICD-10-CM | POA: Diagnosis not present

## 2021-09-11 DIAGNOSIS — I340 Nonrheumatic mitral (valve) insufficiency: Secondary | ICD-10-CM | POA: Diagnosis not present

## 2021-09-11 DIAGNOSIS — Z719 Counseling, unspecified: Secondary | ICD-10-CM | POA: Diagnosis not present

## 2021-09-11 DIAGNOSIS — I341 Nonrheumatic mitral (valve) prolapse: Secondary | ICD-10-CM | POA: Diagnosis not present

## 2021-09-22 DIAGNOSIS — I517 Cardiomegaly: Secondary | ICD-10-CM | POA: Diagnosis not present

## 2021-09-22 DIAGNOSIS — I2510 Atherosclerotic heart disease of native coronary artery without angina pectoris: Secondary | ICD-10-CM | POA: Diagnosis not present

## 2021-09-24 DIAGNOSIS — I340 Nonrheumatic mitral (valve) insufficiency: Secondary | ICD-10-CM | POA: Diagnosis not present

## 2021-09-24 DIAGNOSIS — R011 Cardiac murmur, unspecified: Secondary | ICD-10-CM | POA: Diagnosis not present

## 2021-09-24 DIAGNOSIS — R931 Abnormal findings on diagnostic imaging of heart and coronary circulation: Secondary | ICD-10-CM | POA: Diagnosis not present

## 2021-09-29 DIAGNOSIS — I517 Cardiomegaly: Secondary | ICD-10-CM | POA: Diagnosis not present

## 2021-09-29 DIAGNOSIS — E0590 Thyrotoxicosis, unspecified without thyrotoxic crisis or storm: Secondary | ICD-10-CM | POA: Diagnosis not present

## 2021-09-29 DIAGNOSIS — Z01811 Encounter for preprocedural respiratory examination: Secondary | ICD-10-CM | POA: Diagnosis not present

## 2021-09-29 DIAGNOSIS — Z6835 Body mass index (BMI) 35.0-35.9, adult: Secondary | ICD-10-CM | POA: Diagnosis not present

## 2021-09-29 DIAGNOSIS — I739 Peripheral vascular disease, unspecified: Secondary | ICD-10-CM | POA: Diagnosis not present

## 2021-10-08 DIAGNOSIS — I340 Nonrheumatic mitral (valve) insufficiency: Secondary | ICD-10-CM | POA: Diagnosis not present

## 2021-10-22 DIAGNOSIS — I340 Nonrheumatic mitral (valve) insufficiency: Secondary | ICD-10-CM | POA: Diagnosis not present

## 2021-11-09 DIAGNOSIS — Z9889 Other specified postprocedural states: Secondary | ICD-10-CM | POA: Diagnosis not present

## 2021-11-09 DIAGNOSIS — I2510 Atherosclerotic heart disease of native coronary artery without angina pectoris: Secondary | ICD-10-CM | POA: Diagnosis not present

## 2021-11-09 DIAGNOSIS — I10 Essential (primary) hypertension: Secondary | ICD-10-CM | POA: Diagnosis not present

## 2021-11-09 DIAGNOSIS — I340 Nonrheumatic mitral (valve) insufficiency: Secondary | ICD-10-CM | POA: Diagnosis not present

## 2021-11-09 DIAGNOSIS — I083 Combined rheumatic disorders of mitral, aortic and tricuspid valves: Secondary | ICD-10-CM | POA: Diagnosis not present

## 2021-11-09 DIAGNOSIS — Z01810 Encounter for preprocedural cardiovascular examination: Secondary | ICD-10-CM | POA: Diagnosis not present

## 2021-11-09 DIAGNOSIS — E785 Hyperlipidemia, unspecified: Secondary | ICD-10-CM | POA: Diagnosis not present

## 2021-11-10 DIAGNOSIS — Z9889 Other specified postprocedural states: Secondary | ICD-10-CM | POA: Diagnosis not present

## 2021-11-10 DIAGNOSIS — I340 Nonrheumatic mitral (valve) insufficiency: Secondary | ICD-10-CM | POA: Diagnosis not present

## 2021-11-10 DIAGNOSIS — I2510 Atherosclerotic heart disease of native coronary artery without angina pectoris: Secondary | ICD-10-CM | POA: Diagnosis not present

## 2021-11-10 DIAGNOSIS — E785 Hyperlipidemia, unspecified: Secondary | ICD-10-CM | POA: Diagnosis not present

## 2021-11-11 DIAGNOSIS — I2510 Atherosclerotic heart disease of native coronary artery without angina pectoris: Secondary | ICD-10-CM | POA: Diagnosis not present

## 2021-11-11 DIAGNOSIS — Z9889 Other specified postprocedural states: Secondary | ICD-10-CM | POA: Diagnosis not present

## 2021-11-11 DIAGNOSIS — I340 Nonrheumatic mitral (valve) insufficiency: Secondary | ICD-10-CM | POA: Diagnosis not present

## 2021-11-11 DIAGNOSIS — E785 Hyperlipidemia, unspecified: Secondary | ICD-10-CM | POA: Diagnosis not present

## 2021-11-12 DIAGNOSIS — R739 Hyperglycemia, unspecified: Secondary | ICD-10-CM | POA: Diagnosis not present

## 2021-11-12 DIAGNOSIS — Z794 Long term (current) use of insulin: Secondary | ICD-10-CM | POA: Diagnosis not present

## 2021-11-12 DIAGNOSIS — I340 Nonrheumatic mitral (valve) insufficiency: Secondary | ICD-10-CM | POA: Diagnosis not present

## 2021-11-13 DIAGNOSIS — I340 Nonrheumatic mitral (valve) insufficiency: Secondary | ICD-10-CM | POA: Diagnosis not present

## 2021-11-17 DIAGNOSIS — Z952 Presence of prosthetic heart valve: Secondary | ICD-10-CM | POA: Diagnosis not present
